# Patient Record
Sex: Male | Born: 1995 | Race: Black or African American | Hispanic: No | Marital: Single | Smoking: Current every day smoker
Health system: Southern US, Community
[De-identification: ages and names within clinical notes are randomized; demographics above are authoritative.]

## PROBLEM LIST (undated history)

## (undated) DIAGNOSIS — F1910 Other psychoactive substance abuse, uncomplicated: Secondary | ICD-10-CM

## (undated) DIAGNOSIS — Z789 Other specified health status: Secondary | ICD-10-CM

---
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
## 2020-02-10 ENCOUNTER — Emergency Department (HOSPITAL_COMMUNITY): Payer: Self-pay

## 2020-02-10 ENCOUNTER — Other Ambulatory Visit: Payer: Self-pay

## 2020-02-10 ENCOUNTER — Encounter (HOSPITAL_COMMUNITY): Payer: Self-pay

## 2020-02-10 ENCOUNTER — Inpatient Hospital Stay (HOSPITAL_COMMUNITY)
Admission: EM | Admit: 2020-02-10 | Discharge: 2020-02-15 | DRG: 501 | Disposition: A | Discharge status: Home Health | Payer: Self-pay | Attending: Student | Admitting: Student

## 2020-02-10 DIAGNOSIS — S76312A Strain of muscle, fascia and tendon of the posterior muscle group at thigh level, left thigh, initial encounter: Secondary | ICD-10-CM

## 2020-02-10 DIAGNOSIS — S81802A Unspecified open wound, left lower leg, initial encounter: Secondary | ICD-10-CM | POA: Diagnosis present

## 2020-02-10 DIAGNOSIS — S92002B Unspecified fracture of left calcaneus, initial encounter for open fracture: Secondary | ICD-10-CM | POA: Diagnosis present

## 2020-02-10 DIAGNOSIS — S86112A Strain of other muscle(s) and tendon(s) of posterior muscle group at lower leg level, left leg, initial encounter: Secondary | ICD-10-CM

## 2020-02-10 DIAGNOSIS — F419 Anxiety disorder, unspecified: Secondary | ICD-10-CM | POA: Diagnosis not present

## 2020-02-10 DIAGNOSIS — F17210 Nicotine dependence, cigarettes, uncomplicated: Secondary | ICD-10-CM | POA: Diagnosis present

## 2020-02-10 DIAGNOSIS — S86122A Laceration of other muscle(s) and tendon(s) of posterior muscle group at lower leg level, left leg, initial encounter: Principal | ICD-10-CM | POA: Diagnosis present

## 2020-02-10 DIAGNOSIS — Z91010 Allergy to peanuts: Secondary | ICD-10-CM

## 2020-02-10 DIAGNOSIS — S01112A Laceration without foreign body of left eyelid and periocular area, initial encounter: Secondary | ICD-10-CM | POA: Diagnosis present

## 2020-02-10 DIAGNOSIS — S91312A Laceration without foreign body, left foot, initial encounter: Secondary | ICD-10-CM

## 2020-02-10 DIAGNOSIS — T1490XA Injury, unspecified, initial encounter: Secondary | ICD-10-CM

## 2020-02-10 DIAGNOSIS — S81012A Laceration without foreign body, left knee, initial encounter: Secondary | ICD-10-CM

## 2020-02-10 DIAGNOSIS — D62 Acute posthemorrhagic anemia: Secondary | ICD-10-CM | POA: Diagnosis not present

## 2020-02-10 DIAGNOSIS — S92002A Unspecified fracture of left calcaneus, initial encounter for closed fracture: Secondary | ICD-10-CM

## 2020-02-10 DIAGNOSIS — Z20822 Contact with and (suspected) exposure to covid-19: Secondary | ICD-10-CM | POA: Diagnosis present

## 2020-02-10 DIAGNOSIS — T148XXA Other injury of unspecified body region, initial encounter: Secondary | ICD-10-CM

## 2020-02-10 DIAGNOSIS — S81022A Laceration with foreign body, left knee, initial encounter: Secondary | ICD-10-CM | POA: Diagnosis present

## 2020-02-10 DIAGNOSIS — Z23 Encounter for immunization: Secondary | ICD-10-CM

## 2020-02-10 HISTORY — DX: Other specified health status: Z78.9

## 2020-02-10 HISTORY — DX: Unspecified open wound, left lower leg, initial encounter: S81.802A

## 2020-02-10 LAB — I-STAT CHEM 8, ED
BUN: 10 mg/dL (ref 6–20)
Calcium, Ion: 1.04 mmol/L — ABNORMAL LOW (ref 1.15–1.40)
Chloride: 102 mmol/L (ref 98–111)
Creatinine, Ser: 1.6 mg/dL — ABNORMAL HIGH (ref 0.61–1.24)
Glucose, Bld: 140 mg/dL — ABNORMAL HIGH (ref 70–99)
HCT: 37 % — ABNORMAL LOW (ref 39.0–52.0)
Hemoglobin: 12.6 g/dL — ABNORMAL LOW (ref 13.0–17.0)
Potassium: 2.8 mmol/L — ABNORMAL LOW (ref 3.5–5.1)
Sodium: 139 mmol/L (ref 135–145)
TCO2: 21 mmol/L — ABNORMAL LOW (ref 22–32)

## 2020-02-10 LAB — LACTIC ACID, PLASMA: Lactic Acid, Venous: 3.5 mmol/L (ref 0.5–1.9)

## 2020-02-10 LAB — COMPREHENSIVE METABOLIC PANEL
ALT: 18 U/L (ref 0–44)
AST: 24 U/L (ref 15–41)
Albumin: 3.8 g/dL (ref 3.5–5.0)
Alkaline Phosphatase: 25 U/L — ABNORMAL LOW (ref 38–126)
Anion gap: 14 (ref 5–15)
BUN: 10 mg/dL (ref 6–20)
CO2: 19 mmol/L — ABNORMAL LOW (ref 22–32)
Calcium: 9.1 mg/dL (ref 8.9–10.3)
Chloride: 104 mmol/L (ref 98–111)
Creatinine, Ser: 1.6 mg/dL — ABNORMAL HIGH (ref 0.61–1.24)
GFR calc Af Amer: >60 mL/min (ref >60)
GFR calc non Af Amer: 59 mL/min — ABNORMAL LOW (ref >60)
Glucose, Bld: 143 mg/dL — ABNORMAL HIGH (ref 70–99)
Potassium: 2.9 mmol/L — ABNORMAL LOW (ref 3.5–5.1)
Sodium: 137 mmol/L (ref 135–145)
Total Bilirubin: 0.6 mg/dL (ref 0.3–1.2)
Total Protein: 6.3 g/dL — ABNORMAL LOW (ref 6.5–8.1)

## 2020-02-10 LAB — PROTIME-INR
INR: 1.3 — ABNORMAL HIGH (ref 0.8–1.2)
Prothrombin Time: 15.5 seconds — ABNORMAL HIGH (ref 11.4–15.2)

## 2020-02-10 LAB — TYPE AND SCREEN
ABO/RH(D): AB POS
Antibody Screen: NEGATIVE

## 2020-02-10 LAB — ABO/RH: ABO/RH(D): AB POS

## 2020-02-10 LAB — CBC
HCT: 37.3 % — ABNORMAL LOW (ref 39.0–52.0)
Hemoglobin: 12.1 g/dL — ABNORMAL LOW (ref 13.0–17.0)
MCH: 30.7 pg (ref 26.0–34.0)
MCHC: 32.4 g/dL (ref 30.0–36.0)
MCV: 94.7 fL (ref 80.0–100.0)
Platelets: 226 10*3/uL (ref 150–400)
RBC: 3.94 MIL/uL — ABNORMAL LOW (ref 4.22–5.81)
RDW: 13.2 % (ref 11.5–15.5)
WBC: 9.9 10*3/uL (ref 4.0–10.5)
nRBC: 0 % (ref 0.0–0.2)

## 2020-02-10 LAB — SARS CORONAVIRUS 2 BY RT PCR (HOSPITAL ORDER, PERFORMED IN ~~LOC~~ HOSPITAL LAB): SARS Coronavirus 2: NEGATIVE

## 2020-02-10 LAB — ETHANOL: Alcohol, Ethyl (B): <10 mg/dL (ref <10)

## 2020-02-10 MED ORDER — POTASSIUM CHLORIDE IN NACL 20-0.45 MEQ/L-% IV SOLN
INTRAVENOUS | Status: DC
  Filled 2020-02-10 (×4): qty 1000

## 2020-02-10 MED ORDER — ACETAMINOPHEN 500 MG PO TABS
1000.0000 mg | ORAL_TABLET | Freq: Four times a day (QID) | ORAL | Status: DC
  Administered 2020-02-10 – 2020-02-11 (×2): 1000 mg via ORAL
  Filled 2020-02-10 (×2): qty 2

## 2020-02-10 MED ORDER — KETOROLAC TROMETHAMINE 15 MG/ML IJ SOLN
15.0000 mg | Freq: Four times a day (QID) | INTRAMUSCULAR | Status: DC
  Administered 2020-02-11 (×2): 15 mg via INTRAVENOUS
  Filled 2020-02-10 (×2): qty 1

## 2020-02-10 MED ORDER — TETANUS-DIPHTH-ACELL PERTUSSIS 5-2.5-18.5 LF-MCG/0.5 IM SUSP
0.5000 mL | Freq: Once | INTRAMUSCULAR | Status: AC
  Administered 2020-02-10: 0.5 mL via INTRAMUSCULAR

## 2020-02-10 MED ORDER — ONDANSETRON HCL 4 MG PO TABS: 4.0000 mg | ORAL_TABLET | Freq: Four times a day (QID) | ORAL | Status: DC | PRN

## 2020-02-10 MED ORDER — CEFAZOLIN SODIUM-DEXTROSE 2-4 GM/100ML-% IV SOLN
2.0000 g | Freq: Three times a day (TID) | INTRAVENOUS | Status: DC
  Administered 2020-02-11: 2 g via INTRAVENOUS
  Filled 2020-02-10 (×2): qty 100

## 2020-02-10 MED ORDER — IOHEXOL 350 MG/ML SOLN
100.0000 mL | Freq: Once | INTRAVENOUS | Status: AC | PRN
  Administered 2020-02-10: 100 mL via INTRAVENOUS

## 2020-02-10 MED ORDER — OXYCODONE HCL 5 MG PO TABS
5.0000 mg | ORAL_TABLET | ORAL | Status: DC | PRN
  Administered 2020-02-11: 10 mg via ORAL
  Administered 2020-02-11: 5 mg via ORAL
  Administered 2020-02-12 – 2020-02-15 (×4): 10 mg via ORAL
  Filled 2020-02-10 (×9): qty 2

## 2020-02-10 MED ORDER — ONDANSETRON HCL 4 MG/2ML IJ SOLN
4.0000 mg | Freq: Four times a day (QID) | INTRAMUSCULAR | Status: DC | PRN
  Administered 2020-02-15: 4 mg via INTRAVENOUS
  Filled 2020-02-10: qty 2

## 2020-02-10 MED ORDER — HYDROMORPHONE HCL 1 MG/ML IJ SOLN
INTRAMUSCULAR | Status: AC
  Filled 2020-02-10: qty 1

## 2020-02-10 MED ORDER — ACETAMINOPHEN 325 MG PO TABS: 325.0000 mg | ORAL_TABLET | Freq: Four times a day (QID) | ORAL | Status: DC | PRN

## 2020-02-10 MED ORDER — CEFAZOLIN SODIUM-DEXTROSE 2-4 GM/100ML-% IV SOLN
2.0000 g | Freq: Once | INTRAVENOUS | Status: AC
  Administered 2020-02-10: 2 g via INTRAVENOUS

## 2020-02-10 MED ORDER — SENNA 8.6 MG PO TABS
1.0000 | ORAL_TABLET | Freq: Two times a day (BID) | ORAL | Status: DC
  Administered 2020-02-11 – 2020-02-14 (×6): 8.6 mg via ORAL
  Filled 2020-02-10 (×7): qty 1

## 2020-02-10 MED ORDER — HYDROMORPHONE HCL 1 MG/ML IJ SOLN
INTRAMUSCULAR | Status: AC | PRN
  Administered 2020-02-10: 1 mg via INTRAVENOUS

## 2020-02-10 MED ORDER — HYDROMORPHONE HCL 1 MG/ML IJ SOLN
0.5000 mg | INTRAMUSCULAR | Status: DC | PRN
  Administered 2020-02-10 – 2020-02-12 (×3): 1 mg via INTRAVENOUS
  Filled 2020-02-10 (×2): qty 1

## 2020-02-10 MED ORDER — METHOCARBAMOL 1000 MG/10ML IJ SOLN
500.0000 mg | Freq: Four times a day (QID) | INTRAVENOUS | Status: DC | PRN
  Filled 2020-02-10: qty 5

## 2020-02-10 MED ORDER — DIPHENHYDRAMINE HCL 12.5 MG/5ML PO ELIX: 12.5000 mg | ORAL_SOLUTION | ORAL | Status: DC | PRN

## 2020-02-10 MED ORDER — GABAPENTIN 300 MG PO CAPS
300.0000 mg | ORAL_CAPSULE | Freq: Three times a day (TID) | ORAL | Status: DC
  Administered 2020-02-11 – 2020-02-15 (×13): 300 mg via ORAL
  Filled 2020-02-10 (×13): qty 1

## 2020-02-10 MED ORDER — DOCUSATE SODIUM 100 MG PO CAPS
100.0000 mg | ORAL_CAPSULE | Freq: Two times a day (BID) | ORAL | Status: DC
  Administered 2020-02-11 – 2020-02-15 (×8): 100 mg via ORAL
  Filled 2020-02-10 (×8): qty 1

## 2020-02-10 MED ORDER — METHOCARBAMOL 500 MG PO TABS
500.0000 mg | ORAL_TABLET | Freq: Four times a day (QID) | ORAL | Status: DC | PRN
  Administered 2020-02-15 (×2): 500 mg via ORAL
  Filled 2020-02-10: qty 1
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
## 2020-02-10 NOTE — Progress Notes (Signed)
Chaplain spoke to family in consult room. Family said they were waiting for doctor to come and give report. Chaplain offered up prayer and will be available. Rev. Lynnell Chad Pager [phone]

## 2020-02-10 NOTE — ED Notes (Signed)
Pt to CT w/ RN Carmie Kanner

## 2020-02-10 NOTE — ED Notes (Signed)
MS  Helane Gunther mother [phone]
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
## 2020-02-10 NOTE — Progress Notes (Signed)
Orthopedic Tech Progress Note Patient Details:  Micheal Bryant 1996-06-24 [id number] Level 2 Trauma  Patient ID: Micheal Bryant, male   DOB: 01-23-1996, 24 y.o.   MRN: [id number]   Micheal Bryant 02/10/2020, 9:14 PM
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
## 2020-02-11 ENCOUNTER — Encounter (HOSPITAL_COMMUNITY)
Admission: EM | Disposition: A | Discharge status: Home Health | Payer: Self-pay | Source: Home / Self Care | Attending: Student

## 2020-02-11 ENCOUNTER — Other Ambulatory Visit: Payer: Self-pay

## 2020-02-11 ENCOUNTER — Inpatient Hospital Stay (HOSPITAL_COMMUNITY): Payer: Self-pay | Admitting: Registered Nurse

## 2020-02-11 ENCOUNTER — Encounter (HOSPITAL_COMMUNITY): Payer: Self-pay | Admitting: Student

## 2020-02-11 DIAGNOSIS — S86112A Strain of other muscle(s) and tendon(s) of posterior muscle group at lower leg level, left leg, initial encounter: Secondary | ICD-10-CM

## 2020-02-11 DIAGNOSIS — S92002A Unspecified fracture of left calcaneus, initial encounter for closed fracture: Secondary | ICD-10-CM

## 2020-02-11 DIAGNOSIS — S96922A Laceration of unspecified muscle and tendon at ankle and foot level, left foot, initial encounter: Secondary | ICD-10-CM

## 2020-02-11 DIAGNOSIS — S76312A Strain of muscle, fascia and tendon of the posterior muscle group at thigh level, left thigh, initial encounter: Secondary | ICD-10-CM

## 2020-02-11 DIAGNOSIS — S91312A Laceration without foreign body, left foot, initial encounter: Secondary | ICD-10-CM

## 2020-02-11 DIAGNOSIS — S81012A Laceration without foreign body, left knee, initial encounter: Secondary | ICD-10-CM

## 2020-02-11 HISTORY — DX: Laceration without foreign body, left knee, initial encounter: S81.012A

## 2020-02-11 HISTORY — DX: Laceration without foreign body, left foot, initial encounter: S96.922A

## 2020-02-11 HISTORY — DX: Strain of other muscle(s) and tendon(s) of posterior muscle group at lower leg level, left leg, initial encounter: S86.112A

## 2020-02-11 HISTORY — PX: I & D EXTREMITY: SHX5045

## 2020-02-11 HISTORY — DX: Unspecified fracture of left calcaneus, initial encounter for closed fracture: S92.002A

## 2020-02-11 HISTORY — DX: Strain of muscle, fascia and tendon of the posterior muscle group at thigh level, left thigh, initial encounter: S76.312A

## 2020-02-11 HISTORY — DX: Laceration without foreign body, left foot, initial encounter: S91.312A

## 2020-02-11 HISTORY — PX: APPLICATION OF WOUND VAC: SHX5189

## 2020-02-11 LAB — POCT I-STAT, CHEM 8
BUN: 12 mg/dL (ref 6–20)
Calcium, Ion: 1.2 mmol/L (ref 1.15–1.40)
Chloride: 100 mmol/L (ref 98–111)
Creatinine, Ser: 1.3 mg/dL — ABNORMAL HIGH (ref 0.61–1.24)
Glucose, Bld: 115 mg/dL — ABNORMAL HIGH (ref 70–99)
HCT: 31 % — ABNORMAL LOW (ref 39.0–52.0)
Hemoglobin: 10.5 g/dL — ABNORMAL LOW (ref 13.0–17.0)
Potassium: 4 mmol/L (ref 3.5–5.1)
Sodium: 134 mmol/L — ABNORMAL LOW (ref 135–145)
TCO2: 26 mmol/L (ref 22–32)

## 2020-02-11 LAB — HIV ANTIBODY (ROUTINE TESTING W REFLEX): HIV Screen 4th Generation wRfx: NONREACTIVE

## 2020-02-11 LAB — SURGICAL PCR SCREEN
MRSA, PCR: NEGATIVE
Staphylococcus aureus: NEGATIVE

## 2020-02-11 SURGERY — IRRIGATION AND DEBRIDEMENT EXTREMITY
Anesthesia: General | Laterality: Left

## 2020-02-11 MED ORDER — TOBRAMYCIN SULFATE 1.2 G IJ SOLR
INTRAMUSCULAR | Status: DC | PRN
  Administered 2020-02-11: 1.2 g via TOPICAL

## 2020-02-11 MED ORDER — KETOROLAC TROMETHAMINE 30 MG/ML IJ SOLN
INTRAMUSCULAR | Status: AC
  Filled 2020-02-11: qty 1

## 2020-02-11 MED ORDER — HYDROMORPHONE HCL 1 MG/ML IJ SOLN: 0.2500 mg | INTRAMUSCULAR | Status: DC | PRN

## 2020-02-11 MED ORDER — ROCURONIUM BROMIDE 10 MG/ML (PF) SYRINGE
PREFILLED_SYRINGE | INTRAVENOUS | Status: DC | PRN
  Administered 2020-02-11: 50 mg via INTRAVENOUS

## 2020-02-11 MED ORDER — ONDANSETRON HCL 4 MG/2ML IJ SOLN
INTRAMUSCULAR | Status: DC | PRN
  Administered 2020-02-11: 4 mg via INTRAVENOUS

## 2020-02-11 MED ORDER — DEXAMETHASONE SODIUM PHOSPHATE 10 MG/ML IJ SOLN
INTRAMUSCULAR | Status: DC | PRN
  Administered 2020-02-11: 5 mg via INTRAVENOUS

## 2020-02-11 MED ORDER — ONDANSETRON HCL 4 MG/2ML IJ SOLN
INTRAMUSCULAR | Status: AC
  Filled 2020-02-11: qty 2

## 2020-02-11 MED ORDER — MIDAZOLAM HCL 2 MG/2ML IJ SOLN
INTRAMUSCULAR | Status: AC
  Filled 2020-02-11: qty 2

## 2020-02-11 MED ORDER — SODIUM CHLORIDE 0.9 % IV SOLN
2.0000 g | INTRAVENOUS | Status: DC
  Administered 2020-02-11 – 2020-02-12 (×2): 2 g via INTRAVENOUS
  Filled 2020-02-11 (×2): qty 20

## 2020-02-11 MED ORDER — CHLORHEXIDINE GLUCONATE 0.12 % MT SOLN: 15.0000 mL | Freq: Once | OROMUCOSAL | Status: AC

## 2020-02-11 MED ORDER — FENTANYL CITRATE (PF) 250 MCG/5ML IJ SOLN
INTRAMUSCULAR | Status: AC
  Filled 2020-02-11: qty 5

## 2020-02-11 MED ORDER — DEXMEDETOMIDINE HCL 200 MCG/2ML IV SOLN
INTRAVENOUS | Status: DC | PRN
  Administered 2020-02-11: 8 ug via INTRAVENOUS
  Administered 2020-02-11: 4 ug via INTRAVENOUS
  Administered 2020-02-11 (×3): 8 ug via INTRAVENOUS

## 2020-02-11 MED ORDER — KETOROLAC TROMETHAMINE 15 MG/ML IJ SOLN
15.0000 mg | Freq: Four times a day (QID) | INTRAMUSCULAR | Status: DC
  Administered 2020-02-11 – 2020-02-12 (×3): 15 mg via INTRAVENOUS
  Filled 2020-02-11 (×3): qty 1

## 2020-02-11 MED ORDER — SUGAMMADEX SODIUM 200 MG/2ML IV SOLN
INTRAVENOUS | Status: DC | PRN
  Administered 2020-02-11 (×2): 100 mg via INTRAVENOUS

## 2020-02-11 MED ORDER — PROPOFOL 10 MG/ML IV BOLUS
INTRAVENOUS | Status: AC
  Filled 2020-02-11: qty 20

## 2020-02-11 MED ORDER — SUCCINYLCHOLINE CHLORIDE 200 MG/10ML IV SOSY
PREFILLED_SYRINGE | INTRAVENOUS | Status: AC
  Filled 2020-02-11: qty 10

## 2020-02-11 MED ORDER — MIDAZOLAM HCL 5 MG/5ML IJ SOLN
INTRAMUSCULAR | Status: DC | PRN
  Administered 2020-02-11 (×2): 1 mg via INTRAVENOUS

## 2020-02-11 MED ORDER — VANCOMYCIN HCL 1000 MG IV SOLR
INTRAVENOUS | Status: AC
  Filled 2020-02-11: qty 1000

## 2020-02-11 MED ORDER — SUCCINYLCHOLINE CHLORIDE 200 MG/10ML IV SOSY
PREFILLED_SYRINGE | INTRAVENOUS | Status: DC | PRN
  Administered 2020-02-11: 140 mg via INTRAVENOUS

## 2020-02-11 MED ORDER — ENOXAPARIN SODIUM 40 MG/0.4ML ~~LOC~~ SOLN
40.0000 mg | SUBCUTANEOUS | Status: DC
  Administered 2020-02-12 – 2020-02-14 (×3): 40 mg via SUBCUTANEOUS
  Filled 2020-02-11 (×3): qty 0.4

## 2020-02-11 MED ORDER — LIDOCAINE 2% (20 MG/ML) 5 ML SYRINGE
INTRAMUSCULAR | Status: AC
  Filled 2020-02-11: qty 5

## 2020-02-11 MED ORDER — CHLORHEXIDINE GLUCONATE 0.12 % MT SOLN
OROMUCOSAL | Status: AC
  Administered 2020-02-11: 15 mL via OROMUCOSAL
  Filled 2020-02-11: qty 15

## 2020-02-11 MED ORDER — LACTATED RINGERS IV SOLN: INTRAVENOUS | Status: DC

## 2020-02-11 MED ORDER — ACETAMINOPHEN 500 MG PO TABS
1000.0000 mg | ORAL_TABLET | Freq: Four times a day (QID) | ORAL | Status: DC
  Administered 2020-02-11 – 2020-02-15 (×13): 1000 mg via ORAL
  Filled 2020-02-11 (×13): qty 2

## 2020-02-11 MED ORDER — DEXAMETHASONE SODIUM PHOSPHATE 10 MG/ML IJ SOLN
INTRAMUSCULAR | Status: AC
  Filled 2020-02-11: qty 1

## 2020-02-11 MED ORDER — SODIUM CHLORIDE 0.9 % IR SOLN
Status: DC | PRN
  Administered 2020-02-11: 1000 mL
  Administered 2020-02-11 (×3): 3000 mL

## 2020-02-11 MED ORDER — PROPOFOL 10 MG/ML IV BOLUS
INTRAVENOUS | Status: DC | PRN
  Administered 2020-02-11: 140 mg via INTRAVENOUS

## 2020-02-11 MED ORDER — OXYCODONE HCL 5 MG PO TABS
10.0000 mg | ORAL_TABLET | ORAL | Status: DC | PRN
  Administered 2020-02-11 – 2020-02-13 (×2): 10 mg via ORAL
  Administered 2020-02-13 (×2): 15 mg via ORAL
  Administered 2020-02-14 – 2020-02-15 (×3): 10 mg via ORAL
  Filled 2020-02-11: qty 3
  Filled 2020-02-11: qty 2
  Filled 2020-02-11: qty 3
  Filled 2020-02-11: qty 2

## 2020-02-11 MED ORDER — ROCURONIUM BROMIDE 10 MG/ML (PF) SYRINGE
PREFILLED_SYRINGE | INTRAVENOUS | Status: AC
  Filled 2020-02-11: qty 10

## 2020-02-11 MED ORDER — TOBRAMYCIN SULFATE 1.2 G IJ SOLR
INTRAMUSCULAR | Status: AC
  Filled 2020-02-11: qty 1.2

## 2020-02-11 MED ORDER — FENTANYL CITRATE (PF) 250 MCG/5ML IJ SOLN
INTRAMUSCULAR | Status: DC | PRN
  Administered 2020-02-11 (×3): 50 ug via INTRAVENOUS

## 2020-02-11 MED ORDER — HYDRALAZINE HCL 10 MG PO TABS: 10.0000 mg | ORAL_TABLET | Freq: Four times a day (QID) | ORAL | Status: DC | PRN

## 2020-02-11 MED ORDER — 0.9 % SODIUM CHLORIDE (POUR BTL) OPTIME
TOPICAL | Status: DC | PRN
  Administered 2020-02-11: 1000 mL

## 2020-02-11 MED ORDER — LIDOCAINE 2% (20 MG/ML) 5 ML SYRINGE
INTRAMUSCULAR | Status: DC | PRN
  Administered 2020-02-11: 100 mg via INTRAVENOUS

## 2020-02-11 MED ORDER — VANCOMYCIN HCL 1000 MG IV SOLR
INTRAVENOUS | Status: DC | PRN
  Administered 2020-02-11: 1000 mg via TOPICAL

## 2020-02-11 SURGICAL SUPPLY — 62 items
BNDG COHESIVE 4X5 TAN STRL (GAUZE/BANDAGES/DRESSINGS) ×3 IMPLANT
BNDG ELASTIC 4X5.8 VLCR STR LF (GAUZE/BANDAGES/DRESSINGS) IMPLANT
BNDG GAUZE ELAST 4 BULKY (GAUZE/BANDAGES/DRESSINGS) ×6 IMPLANT
BRUSH SCRUB EZ PLAIN DRY (MISCELLANEOUS) ×6 IMPLANT
CANISTER SUCT 3000ML PPV (MISCELLANEOUS) ×3 IMPLANT
CANISTER WOUND CARE 500ML ATS (WOUND CARE) ×3 IMPLANT
CHLORAPREP W/TINT 26 (MISCELLANEOUS) ×3 IMPLANT
CONNECTOR Y ATS VAC SYSTEM (MISCELLANEOUS) ×3 IMPLANT
COVER MAYO STAND STRL (DRAPES) ×3 IMPLANT
COVER SURGICAL LIGHT HANDLE (MISCELLANEOUS) ×6 IMPLANT
COVER WAND RF STERILE (DRAPES) ×3 IMPLANT
DRAPE HALF SHEET 40X57 (DRAPES) IMPLANT
DRAPE INCISE IOBAN 66X45 STRL (DRAPES) IMPLANT
DRAPE ORTHO SPLIT 77X108 STRL (DRAPES) ×3
DRAPE SURG 17X23 STRL (DRAPES) ×3 IMPLANT
DRAPE SURG ORHT 6 SPLT 77X108 (DRAPES) ×1 IMPLANT
DRAPE U-SHAPE 47X51 STRL (DRAPES) ×3 IMPLANT
DRESSING VERAFLO CLEANSE CC (GAUZE/BANDAGES/DRESSINGS) ×1 IMPLANT
DRSG ADAPTIC 3X8 NADH LF (GAUZE/BANDAGES/DRESSINGS) ×3 IMPLANT
DRSG PAD ABDOMINAL 8X10 ST (GAUZE/BANDAGES/DRESSINGS) IMPLANT
DRSG VAC ATS LRG SENSATRAC (GAUZE/BANDAGES/DRESSINGS) IMPLANT
DRSG VAC ATS MED SENSATRAC (GAUZE/BANDAGES/DRESSINGS) IMPLANT
DRSG VAC ATS SM SENSATRAC (GAUZE/BANDAGES/DRESSINGS) IMPLANT
DRSG VERAFLO CLEANSE CC (GAUZE/BANDAGES/DRESSINGS) ×3
ELECT REM PT RETURN 9FT ADLT (ELECTROSURGICAL) ×3
ELECTRODE REM PT RTRN 9FT ADLT (ELECTROSURGICAL) ×1 IMPLANT
EVACUATOR 1/8 PVC DRAIN (DRAIN) IMPLANT
GAUZE SPONGE 4X4 12PLY STRL (GAUZE/BANDAGES/DRESSINGS) ×3 IMPLANT
GAUZE XEROFORM 5X9 LF (GAUZE/BANDAGES/DRESSINGS) ×3 IMPLANT
GLOVE BIO SURGEON STRL SZ 6.5 (GLOVE) ×6 IMPLANT
GLOVE BIO SURGEON STRL SZ7.5 (GLOVE) ×12 IMPLANT
GLOVE BIO SURGEONS STRL SZ 6.5 (GLOVE) ×3
GLOVE BIOGEL PI IND STRL 6.5 (GLOVE) ×1 IMPLANT
GLOVE BIOGEL PI IND STRL 7.5 (GLOVE) ×1 IMPLANT
GLOVE BIOGEL PI INDICATOR 6.5 (GLOVE) ×2
GLOVE BIOGEL PI INDICATOR 7.5 (GLOVE) ×2
GOWN STRL REUS W/ TWL LRG LVL3 (GOWN DISPOSABLE) ×2 IMPLANT
GOWN STRL REUS W/TWL LRG LVL3 (GOWN DISPOSABLE) ×6
HANDPIECE INTERPULSE COAX TIP (DISPOSABLE)
IMMOBILIZER KNEE 22 UNIV (SOFTGOODS) ×3 IMPLANT
KIT BASIN OR (CUSTOM PROCEDURE TRAY) ×3 IMPLANT
KIT TURNOVER KIT B (KITS) ×3 IMPLANT
MANIFOLD NEPTUNE II (INSTRUMENTS) ×3 IMPLANT
NS IRRIG 1000ML POUR BTL (IV SOLUTION) ×3 IMPLANT
PACK ORTHO EXTREMITY (CUSTOM PROCEDURE TRAY) ×3 IMPLANT
PAD ARMBOARD 7.5X6 YLW CONV (MISCELLANEOUS) ×6 IMPLANT
PAD NEG PRESSURE SENSATRAC (MISCELLANEOUS) ×6 IMPLANT
PADDING CAST COTTON 6X4 STRL (CAST SUPPLIES) ×3 IMPLANT
SET HNDPC FAN SPRY TIP SCT (DISPOSABLE) IMPLANT
SPONGE LAP 18X18 RF (DISPOSABLE) ×3 IMPLANT
SUT ETHILON 2 0 FS 18 (SUTURE) ×6 IMPLANT
SUT ETHILON 3 0 PS 1 (SUTURE) ×6 IMPLANT
SUT MON AB 2-0 CT1 36 (SUTURE) ×3 IMPLANT
SUT PDS AB 0 CT 36 (SUTURE) IMPLANT
SWAB CULTURE ESWAB REG 1ML (MISCELLANEOUS) IMPLANT
TOWEL GREEN STERILE (TOWEL DISPOSABLE) ×6 IMPLANT
TOWEL GREEN STERILE FF (TOWEL DISPOSABLE) ×3 IMPLANT
TUBE CONNECTING 12'X1/4 (SUCTIONS) ×1
TUBE CONNECTING 12X1/4 (SUCTIONS) ×2 IMPLANT
UNDERPAD 30X36 HEAVY ABSORB (UNDERPADS AND DIAPERS) ×3 IMPLANT
WATER STERILE IRR 1000ML POUR (IV SOLUTION) ×3 IMPLANT
YANKAUER SUCT BULB TIP NO VENT (SUCTIONS) ×3 IMPLANT

## 2020-02-11 NOTE — Anesthesia Preprocedure Evaluation (Signed)
Anesthesia Evaluation  Patient identified by MRN, date of birth, ID band Patient awake    Reviewed: Allergy & Precautions, NPO status , Patient's Chart, lab work & pertinent test results  Airway Mallampati: II  TM Distance: >3 FB     Dental   Pulmonary Current Smoker,    breath sounds clear to auscultation       Cardiovascular  Rhythm:Regular Rate:Normal     Neuro/Psych    GI/Hepatic negative GI ROS, Neg liver ROS,   Endo/Other  negative endocrine ROS  Renal/GU negative Renal ROS     Musculoskeletal   Abdominal   Peds  Hematology   Anesthesia Other Findings   Reproductive/Obstetrics                             Anesthesia Physical Anesthesia Plan  ASA: II  Anesthesia Plan: General   Post-op Pain Management:    Induction: Intravenous  PONV Risk Score and Plan: 2 and Ondansetron, Dexamethasone and Midazolam  Airway Management Planned: Oral ETT  Additional Equipment:   Intra-op Plan:   Post-operative Plan: Extubation in OR  Informed Consent: I have reviewed the patients History and Physical, chart, labs and discussed the procedure including the risks, benefits and alternatives for the proposed anesthesia with the patient or authorized representative who has indicated his/her understanding and acceptance.     Interpreter used for [employer] Discussed with: CRNA  Anesthesia Plan Comments:         Anesthesia Quick Evaluation

## 2020-02-11 NOTE — H&P (Signed)
Please see orthopaedic consult note from same date for full orthopaedic H&P.  Roby Lofts, MD [facility] [phone] (office) [email or web address]
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
## 2020-02-11 NOTE — Transfer of Care (Signed)
Immediate Anesthesia Transfer of Care Note  Patient: Micheal Bryant  Procedure(s) Performed: IRRIGATION AND DEBRIDEMENT EXTREMITY (Left ) APPLICATION OF WOUND VAC (Left )  Patient Location: PACU  Anesthesia Type:General  Level of Consciousness: awake, drowsy and patient cooperative  Airway & Oxygen Therapy: Patient Spontanous Breathing and Patient connected to nasal cannula oxygen  Post-op Assessment: Report given to RN, Post -op Vital signs reviewed and stable and Patient moving all extremities X 4  Post vital signs: Reviewed and stable  Last Vitals:  Vitals Value Taken Time  BP 114/70 02/11/20 1340  Temp    Pulse    Resp 17 02/11/20 1340  SpO2    Vitals shown include unvalidated device data.  Last Pain:  Vitals:   02/11/20 0914  TempSrc:   PainSc: 9       Patients Stated Pain Goal: 0 (02/11/20 0914)  Complications: No complications documented.

## 2020-02-11 NOTE — Anesthesia Procedure Notes (Signed)
Procedure Name: Intubation Date/Time: 02/11/2020 11:37 AM Performed by: Lanell Matar, CRNA Pre-anesthesia Checklist: Patient identified, Emergency Drugs available, Suction available and Patient being monitored Patient Re-evaluated:Patient Re-evaluated prior to induction Oxygen Delivery Method: Circle System Utilized Preoxygenation: Pre-oxygenation with 100% oxygen Induction Type: IV induction Ventilation: Mask ventilation without difficulty Laryngoscope Size: Miller and 2 Grade View: Grade I Tube type: Oral Tube size: 7.5 mm Number of attempts: 1 Airway Equipment and Method: Stylet and Oral airway Placement Confirmation: ETT inserted through vocal cords under direct vision,  positive ETCO2 and breath sounds checked- equal and bilateral Secured at: 22 cm Tube secured with: Tape Dental Injury: Teeth and Oropharynx as per pre-operative assessment
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
## 2020-02-11 NOTE — Anesthesia Postprocedure Evaluation (Signed)
Anesthesia Post Note  Patient: Micheal Bryant  Procedure(s) Performed: IRRIGATION AND DEBRIDEMENT EXTREMITY (Left ) APPLICATION OF WOUND VAC (Left )     Patient location during evaluation: PACU Anesthesia Type: General Level of consciousness: awake Pain management: pain level controlled Vital Signs Assessment: post-procedure vital signs reviewed and stable Respiratory status: spontaneous breathing Cardiovascular status: stable Postop Assessment: no apparent nausea or vomiting Anesthetic complications: no   No complications documented.  Last Vitals:  Vitals:   02/11/20 1420 02/11/20 1425  BP:  113/76  Pulse: 69 69  Resp: 12 12  Temp:    SpO2: 99% 98%    Last Pain:  Vitals:   02/11/20 1400  TempSrc:   PainSc: Asleep                 Jasson Siegmann

## 2020-02-12 ENCOUNTER — Encounter (HOSPITAL_COMMUNITY): Payer: Self-pay | Admitting: Student

## 2020-02-12 LAB — CBC
HCT: 28.5 % — ABNORMAL LOW (ref 39.0–52.0)
Hemoglobin: 9.4 g/dL — ABNORMAL LOW (ref 13.0–17.0)
MCH: 30.9 pg (ref 26.0–34.0)
MCHC: 33 g/dL (ref 30.0–36.0)
MCV: 93.8 fL (ref 80.0–100.0)
Platelets: 152 10*3/uL (ref 150–400)
RBC: 3.04 MIL/uL — ABNORMAL LOW (ref 4.22–5.81)
RDW: 13.2 % (ref 11.5–15.5)
WBC: 9.4 10*3/uL (ref 4.0–10.5)
nRBC: 0 % (ref 0.0–0.2)

## 2020-02-12 LAB — VITAMIN D 25 HYDROXY (VIT D DEFICIENCY, FRACTURES): Vit D, 25-Hydroxy: 12.91 ng/mL — ABNORMAL LOW (ref 30–100)

## 2020-02-12 MED ORDER — SODIUM CHLORIDE 0.9 % IV SOLN
2.0000 g | INTRAVENOUS | Status: AC
  Administered 2020-02-13 – 2020-02-14 (×2): 2 g via INTRAVENOUS
  Filled 2020-02-12 (×2): qty 20

## 2020-02-12 MED ORDER — LORAZEPAM 0.5 MG PO TABS
0.5000 mg | ORAL_TABLET | Freq: Four times a day (QID) | ORAL | Status: DC | PRN
  Administered 2020-02-12 – 2020-02-14 (×4): 1 mg via ORAL
  Filled 2020-02-12 (×4): qty 2

## 2020-02-12 NOTE — Progress Notes (Signed)
Rehab Admissions Coordinator Note:  Per PT recommendation, this patient was screened by Cheri Rous for appropriateness for an Inpatient Acute Rehab Consult. It is noted per PT note that despite education on benefits, the pt is declining post acute rehab at this time. *IF pt were to change his mind and want to pursue an IP Rehab program, please contact this AC.   Cheri Rous 02/12/2020, 2:01 PM  I can be reached at [phone].
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
## 2020-02-13 NOTE — Progress Notes (Signed)
Wound vac is not plugged to the wall during bedside reporting. Wound vac restarted however keeps saying on the monitor 'blockage alert'. Wound vac dressing on the left thigh are clean and intact as well as on the left heel. However, the tubings on the left heel has some dried blood and not suctioning anything and seemed like occlusion is coming from it. Will notify the doctor.

## 2020-02-13 NOTE — Plan of Care (Signed)
  Problem: Education: Goal: Knowledge of General Education information will improve Description: Including pain rating scale, medication(s)/side effects and non-pharmacologic comfort measures Outcome: Progressing   Problem: Health Behavior/Discharge Planning: Goal: Ability to manage health-related needs will improve Outcome: Progressing   Problem: Activity: Goal: Risk for activity intolerance will decrease Outcome: Progressing   Problem: Nutrition: Goal: Adequate nutrition will be maintained Outcome: Progressing   Problem: Coping: Goal: Level of anxiety will decrease Outcome: Progressing   Problem: Elimination: Goal: Will not experience complications related to bowel motility Outcome: Progressing   Problem: Safety: Goal: Ability to remain free from injury will improve Outcome: Progressing   Problem: Skin Integrity: Goal: Risk for impaired skin integrity will decrease Outcome: Progressing   
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
## 2020-02-13 NOTE — Plan of Care (Signed)
  Problem: Education: Goal: Knowledge of General Education information will improve Description Including pain rating scale, medication(s)/side effects and non-pharmacologic comfort measures Outcome: Progressing   Problem: Health Behavior/Discharge Planning: Goal: Ability to manage health-related needs will improve Outcome: Progressing   

## 2020-02-14 ENCOUNTER — Encounter (HOSPITAL_COMMUNITY): Payer: Self-pay | Admitting: Student

## 2020-02-14 LAB — CBC
HCT: 25.1 % — ABNORMAL LOW (ref 39.0–52.0)
Hemoglobin: 8.3 g/dL — ABNORMAL LOW (ref 13.0–17.0)
MCH: 31 pg (ref 26.0–34.0)
MCHC: 33.1 g/dL (ref 30.0–36.0)
MCV: 93.7 fL (ref 80.0–100.0)
Platelets: 173 10*3/uL (ref 150–400)
RBC: 2.68 MIL/uL — ABNORMAL LOW (ref 4.22–5.81)
RDW: 13.1 % (ref 11.5–15.5)
WBC: 5 10*3/uL (ref 4.0–10.5)
nRBC: 0 % (ref 0.0–0.2)

## 2020-02-14 LAB — BASIC METABOLIC PANEL
Anion gap: 7 (ref 5–15)
BUN: <5 mg/dL — ABNORMAL LOW (ref 6–20)
CO2: 25 mmol/L (ref 22–32)
Calcium: 8.4 mg/dL — ABNORMAL LOW (ref 8.9–10.3)
Chloride: 102 mmol/L (ref 98–111)
Creatinine, Ser: 1.04 mg/dL (ref 0.61–1.24)
GFR calc Af Amer: >60 mL/min (ref >60)
GFR calc non Af Amer: >60 mL/min (ref >60)
Glucose, Bld: 105 mg/dL — ABNORMAL HIGH (ref 70–99)
Potassium: 3.4 mmol/L — ABNORMAL LOW (ref 3.5–5.1)
Sodium: 134 mmol/L — ABNORMAL LOW (ref 135–145)

## 2020-02-14 NOTE — Anesthesia Preprocedure Evaluation (Addendum)
Anesthesia Evaluation  Patient identified by MRN, date of birth, ID band Patient awake    Reviewed: Allergy & Precautions, NPO status , Patient's Chart, lab work & pertinent test results  Airway Mallampati: II  TM Distance: >3 FB Neck ROM: Full    Dental  (+) Teeth Intact   Pulmonary Current Smoker and Patient abstained from smoking.,    Pulmonary exam normal breath sounds clear to auscultation       Cardiovascular negative cardio ROS Normal cardiovascular exam Rhythm:Regular Rate:Normal     Neuro/Psych  Neuromuscular disease negative psych ROS   GI/Hepatic negative GI ROS, Neg liver ROS,   Endo/Other  negative endocrine ROS  Renal/GU negative Renal ROS  negative genitourinary   Musculoskeletal Open wound left leg Left hamstring tear   Abdominal   Peds  Hematology negative hematology ROS (+)   Anesthesia Other Findings   Reproductive/Obstetrics                            Anesthesia Physical Anesthesia Plan  ASA: II  Anesthesia Plan: General   Post-op Pain Management:    Induction: Intravenous  PONV Risk Score and Plan: 2 and Ondansetron, Midazolam and Treatment may vary due to age or medical condition  Airway Management Planned: Oral ETT  Additional Equipment:   Intra-op Plan:   Post-operative Plan: Extubation in OR  Informed Consent: I have reviewed the patients History and Physical, chart, labs and discussed the procedure including the risks, benefits and alternatives for the proposed anesthesia with the patient or authorized representative who has indicated his/her understanding and acceptance.     Dental advisory given  Plan Discussed with: CRNA and Surgeon  Anesthesia Plan Comments:        Anesthesia Quick Evaluation

## 2020-02-14 NOTE — Progress Notes (Signed)
   ORTHOPAEDIC PROGRESS NOTE  s/p Procedure(s): 1. Irrigation and debridement of left posterior leg wound involving muscle and fascia (total size: 200 sq cm) 2. Irrigation and debridement of left open calcaneus fracture 3. Closed treatment of left calcaneus fracture 4. Intermediate closure of left heel pad avulsion (14 cm length) 5. Intermediate repair of left posterior leg laceration (25cm length)  5. Incisional wound vac placement On 02/11/2020 with Dr. Jena Gauss  SUBJECTIVE:  Wound vac on left heel had some clotting within the tube last night. "Blockage alert" was noted on the monitor. Spoke with the nursing staff over night. We discussed to flush/change tubing versus applying new dressing. Wound vac dressing on left heel was changed last night. Patient continues to want to be left alone. He is pleasant with conversation however. Pain manageable with medications. No chest pain. No SOB. No nausea/vomiting. No other complaints.  OBJECTIVE: PE: General: Resting in hospital bed. No acute distress.  Respiratory: No increased work of breathing.  Left Lower Extremity: Knee Immobilizer in place. Incisional vac in place posterior leg and heel with good seal and function, ~150 mL output in wound vac canister. Endorses distal sensation. Ankle dorsiflexion/plantarflexion intact. Able to wiggle each of his toes. Extremity warm.   Vitals:   02/14/20 0345 02/14/20 0826  BP: (!) 112/56 117/69  Pulse: 84 99  Resp: 15 16  Temp: 99.1 F (37.3 C) 98.6 F (37 C)  SpO2: 98% 100%    ASSESSMENT & PLAN: Micheal Bryant is a 24 y.o. male POD#3  Injuries:  1. Left posterior knee laceration s/p I&D, wound closure, and placement of wound vac 2. Laceration of gastrocnemius and hamstring muscle bellies s/p I&D, wound closure, and placement of wound vac 3. Left heel pad laceration s/p I&D, wound closure, and placement of wound vac 4. Avulsion left calcaneus fracture s/p I&D and closed treatment                   Weightbearing: NWB LLE                  Insicional and dressing care: Dressings left intact until follow-up. Left heel wound vac had a blockage in the tubing and was changed overnight by nursing staff. Wound vac with good seal and suction both posterior leg and left heel.                   Showering: No showering yet. Okay for bed bath                  Orthopedic device(s): Wound KCL:EXNT knee, Left foot and Knee immobilizer LLE  CV/Blood loss:  Hemodynamically stable  Pain management:  1. Tylenol 1000 mg q 6 hours scheduled 2. Robaxin 500 mg q 6 hours PRN 3. Oxycodone 5-15 mg q 4 hours PRN 4. Neurontin 300 mg TID 5. Dilaudid 0.5-1 mg q 4 hours PRN  Anxiety: po Ativan as needed for anxiety VTE prophylaxis: Lovenox, SCDs ID: Ceftriaxone 2 gm x 48 hours Foley/Lines: No foley, KVO IVFs Medical co-morbidities: None noted Dispo: Dispo: Therapies as tolerated. Plan to return to OR on Monday 02/15/20 for repeat I&D LLE.   - NPO midnight   Contact information: After hours and holidays please check [email or web address] for group call information for [facility]   Alfonse Alpers, PA-C 02/14/2020

## 2020-02-14 NOTE — Progress Notes (Signed)
Wound vac dressing on left heel was clotted with some dried blood on the tubings. Wound vac dressing was changed per PA order. Wound vac now working with good seal on continuous suction. Will continue to monitor,.

## 2020-02-14 NOTE — Plan of Care (Signed)

## 2020-02-14 NOTE — Plan of Care (Signed)
  Problem: Coping: Goal: Level of anxiety will decrease Outcome: Progressing   Problem: Pain Managment: Goal: General experience of comfort will improve Outcome: Progressing   Problem: Safety: Goal: Ability to remain free from injury will improve Outcome: Progressing   Problem: Skin Integrity: Goal: Risk for impaired skin integrity will decrease Outcome: Progressing   

## 2020-02-14 NOTE — Plan of Care (Signed)
  Problem: Education: Goal: Knowledge of General Education information will improve Description: Including pain rating scale, medication(s)/side effects and non-pharmacologic comfort measures Outcome: Progressing   Problem: Nutrition: Goal: Adequate nutrition will be maintained Outcome: Progressing   Problem: Coping: Goal: Level of anxiety will decrease Outcome: Progressing   Problem: Elimination: Goal: Will not experience complications related to bowel motility Outcome: Progressing   Problem: Pain Managment: Goal: General experience of comfort will improve Outcome: Progressing   Problem: Safety: Goal: Ability to remain free from injury will improve Outcome: Progressing   Problem: Skin Integrity: Goal: Risk for impaired skin integrity will decrease Outcome: Progressing   

## 2020-02-15 ENCOUNTER — Encounter (HOSPITAL_COMMUNITY)
Admission: EM | Disposition: A | Discharge status: Home Health | Payer: Self-pay | Source: Home / Self Care | Attending: Student

## 2020-02-15 ENCOUNTER — Encounter (HOSPITAL_COMMUNITY): Payer: Self-pay | Admitting: Student

## 2020-02-15 ENCOUNTER — Inpatient Hospital Stay (HOSPITAL_COMMUNITY): Payer: Self-pay | Admitting: Anesthesiology

## 2020-02-15 HISTORY — PX: IRRIGATION AND DEBRIDEMENT KNEE: SHX5185

## 2020-02-15 HISTORY — PX: APPLICATION OF WOUND VAC: SHX5189

## 2020-02-15 SURGERY — IRRIGATION AND DEBRIDEMENT KNEE
Anesthesia: General | Site: Knee | Laterality: Left

## 2020-02-15 MED ORDER — PHENYLEPHRINE 40 MCG/ML (10ML) SYRINGE FOR IV PUSH (FOR BLOOD PRESSURE SUPPORT)
PREFILLED_SYRINGE | INTRAVENOUS | Status: AC
  Filled 2020-02-15: qty 10

## 2020-02-15 MED ORDER — DOCUSATE SODIUM 100 MG PO CAPS: 100.0000 mg | ORAL_CAPSULE | Freq: Two times a day (BID) | ORAL | 0 refills | Status: DC

## 2020-02-15 MED ORDER — FENTANYL CITRATE (PF) 100 MCG/2ML IJ SOLN
INTRAMUSCULAR | Status: DC | PRN
  Administered 2020-02-15 (×2): 50 ug via INTRAVENOUS
  Administered 2020-02-15: 100 ug via INTRAVENOUS
  Administered 2020-02-15: 50 ug via INTRAVENOUS

## 2020-02-15 MED ORDER — MEPERIDINE HCL 25 MG/ML IJ SOLN: 6.2500 mg | INTRAMUSCULAR | Status: DC | PRN

## 2020-02-15 MED ORDER — HYDROMORPHONE HCL 1 MG/ML IJ SOLN
0.2500 mg | INTRAMUSCULAR | Status: DC | PRN
  Administered 2020-02-15 (×3): 0.5 mg via INTRAVENOUS

## 2020-02-15 MED ORDER — METHOCARBAMOL 500 MG PO TABS: 500.0000 mg | ORAL_TABLET | Freq: Four times a day (QID) | ORAL | 0 refills | Status: DC | PRN

## 2020-02-15 MED ORDER — ONDANSETRON HCL 4 MG/2ML IJ SOLN: 4.0000 mg | Freq: Once | INTRAMUSCULAR | Status: DC | PRN

## 2020-02-15 MED ORDER — DEXAMETHASONE SODIUM PHOSPHATE 10 MG/ML IJ SOLN
INTRAMUSCULAR | Status: DC | PRN
  Administered 2020-02-15: 6 mg via INTRAVENOUS

## 2020-02-15 MED ORDER — ONDANSETRON HCL 4 MG/2ML IJ SOLN
INTRAMUSCULAR | Status: DC | PRN
  Administered 2020-02-15: 4 mg via INTRAVENOUS

## 2020-02-15 MED ORDER — ONDANSETRON HCL 4 MG/2ML IJ SOLN
INTRAMUSCULAR | Status: AC
  Filled 2020-02-15: qty 2

## 2020-02-15 MED ORDER — DEXMEDETOMIDINE HCL 200 MCG/2ML IV SOLN
INTRAVENOUS | Status: DC | PRN
  Administered 2020-02-15 (×4): 20 ug via INTRAVENOUS

## 2020-02-15 MED ORDER — VANCOMYCIN HCL 1000 MG IV SOLR
INTRAVENOUS | Status: DC | PRN
  Administered 2020-02-15: 2000 mg via TOPICAL

## 2020-02-15 MED ORDER — METHOCARBAMOL 500 MG PO TABS
ORAL_TABLET | ORAL | Status: AC
  Filled 2020-02-15: qty 1

## 2020-02-15 MED ORDER — ASPIRIN 325 MG PO TABS: 325.0000 mg | ORAL_TABLET | Freq: Two times a day (BID) | ORAL | 0 refills | Status: AC

## 2020-02-15 MED ORDER — SUGAMMADEX SODIUM 200 MG/2ML IV SOLN
INTRAVENOUS | Status: DC | PRN
  Administered 2020-02-15: 200 mg via INTRAVENOUS

## 2020-02-15 MED ORDER — MIDAZOLAM HCL 5 MG/5ML IJ SOLN
INTRAMUSCULAR | Status: DC | PRN
  Administered 2020-02-15: 2 mg via INTRAVENOUS

## 2020-02-15 MED ORDER — PROPOFOL 10 MG/ML IV BOLUS
INTRAVENOUS | Status: AC
  Filled 2020-02-15: qty 20

## 2020-02-15 MED ORDER — ASPIRIN 325 MG PO TABS: 325.0000 mg | ORAL_TABLET | Freq: Two times a day (BID) | ORAL | Status: DC

## 2020-02-15 MED ORDER — ROCURONIUM BROMIDE 10 MG/ML (PF) SYRINGE
PREFILLED_SYRINGE | INTRAVENOUS | Status: AC
  Filled 2020-02-15: qty 10

## 2020-02-15 MED ORDER — CEFAZOLIN SODIUM 1 G IJ SOLR
INTRAMUSCULAR | Status: AC
  Filled 2020-02-15: qty 10

## 2020-02-15 MED ORDER — PHENYLEPHRINE 40 MCG/ML (10ML) SYRINGE FOR IV PUSH (FOR BLOOD PRESSURE SUPPORT)
PREFILLED_SYRINGE | INTRAVENOUS | Status: DC | PRN
  Administered 2020-02-15 (×3): 80 ug via INTRAVENOUS

## 2020-02-15 MED ORDER — MIDAZOLAM HCL 2 MG/2ML IJ SOLN
INTRAMUSCULAR | Status: AC
  Filled 2020-02-15: qty 2

## 2020-02-15 MED ORDER — DOXYCYCLINE HYCLATE 50 MG PO CAPS
100.0000 mg | ORAL_CAPSULE | Freq: Two times a day (BID) | ORAL | 0 refills | Status: AC
Start: 2020-02-15 — End: 2020-02-22

## 2020-02-15 MED ORDER — ACETAMINOPHEN 500 MG PO TABS: 1000.0000 mg | ORAL_TABLET | Freq: Four times a day (QID) | ORAL | 0 refills | Status: DC

## 2020-02-15 MED ORDER — CEFAZOLIN SODIUM-DEXTROSE 1-4 GM/50ML-% IV SOLN
INTRAVENOUS | Status: DC | PRN
Start: 2020-02-15 — End: 2020-02-15
  Administered 2020-02-15: 1 g via INTRAVENOUS

## 2020-02-15 MED ORDER — LIDOCAINE 2% (20 MG/ML) 5 ML SYRINGE
INTRAMUSCULAR | Status: DC | PRN
  Administered 2020-02-15: 60 mg via INTRAVENOUS

## 2020-02-15 MED ORDER — GLYCOPYRROLATE PF 0.2 MG/ML IJ SOSY
PREFILLED_SYRINGE | INTRAMUSCULAR | Status: DC | PRN
  Administered 2020-02-15: .1 mg via INTRAVENOUS

## 2020-02-15 MED ORDER — HYDROMORPHONE HCL 1 MG/ML IJ SOLN
INTRAMUSCULAR | Status: AC
  Filled 2020-02-15: qty 1

## 2020-02-15 MED ORDER — GLYCOPYRROLATE PF 0.2 MG/ML IJ SOSY
PREFILLED_SYRINGE | INTRAMUSCULAR | Status: AC
  Filled 2020-02-15: qty 1

## 2020-02-15 MED ORDER — ROCURONIUM BROMIDE 10 MG/ML (PF) SYRINGE
PREFILLED_SYRINGE | INTRAVENOUS | Status: DC | PRN
  Administered 2020-02-15: 50 mg via INTRAVENOUS

## 2020-02-15 MED ORDER — PROPOFOL 10 MG/ML IV BOLUS
INTRAVENOUS | Status: DC | PRN
  Administered 2020-02-15: 200 mg via INTRAVENOUS

## 2020-02-15 MED ORDER — VANCOMYCIN HCL 1000 MG IV SOLR
INTRAVENOUS | Status: AC
  Filled 2020-02-15: qty 2000

## 2020-02-15 MED ORDER — ONDANSETRON HCL 4 MG PO TABS: 4.0000 mg | ORAL_TABLET | Freq: Four times a day (QID) | ORAL | 0 refills | Status: DC | PRN

## 2020-02-15 MED ORDER — TOBRAMYCIN SULFATE 1.2 G IJ SOLR
INTRAMUSCULAR | Status: DC | PRN
  Administered 2020-02-15: 2.4 g via TOPICAL

## 2020-02-15 MED ORDER — OXYCODONE HCL 5 MG PO TABS: 5.0000 mg | ORAL_TABLET | ORAL | 0 refills | Status: DC | PRN

## 2020-02-15 MED ORDER — LORAZEPAM 0.5 MG PO TABS: 0.5000 mg | ORAL_TABLET | Freq: Four times a day (QID) | ORAL | 0 refills | Status: DC | PRN

## 2020-02-15 MED ORDER — TOBRAMYCIN SULFATE 1.2 G IJ SOLR
INTRAMUSCULAR | Status: AC
  Filled 2020-02-15: qty 2.4

## 2020-02-15 MED ORDER — 0.9 % SODIUM CHLORIDE (POUR BTL) OPTIME
TOPICAL | Status: DC | PRN
  Administered 2020-02-15: 1000 mL

## 2020-02-15 MED ORDER — GABAPENTIN 300 MG PO CAPS: 300.0000 mg | ORAL_CAPSULE | Freq: Three times a day (TID) | ORAL | 0 refills | Status: DC

## 2020-02-15 MED ORDER — FENTANYL CITRATE (PF) 250 MCG/5ML IJ SOLN
INTRAMUSCULAR | Status: AC
  Filled 2020-02-15: qty 5

## 2020-02-15 MED ORDER — DEXAMETHASONE SODIUM PHOSPHATE 10 MG/ML IJ SOLN
INTRAMUSCULAR | Status: AC
  Filled 2020-02-15: qty 1

## 2020-02-15 MED ORDER — LACTATED RINGERS IV SOLN: INTRAVENOUS | Status: DC | PRN

## 2020-02-15 MED ORDER — HYDROCODONE-ACETAMINOPHEN 7.5-325 MG PO TABS: 1.0000 | ORAL_TABLET | Freq: Once | ORAL | Status: DC | PRN

## 2020-02-15 MED ORDER — LIDOCAINE 2% (20 MG/ML) 5 ML SYRINGE
INTRAMUSCULAR | Status: AC
  Filled 2020-02-15: qty 5

## 2020-02-15 SURGICAL SUPPLY — 40 items
BNDG ELASTIC 4X5.8 VLCR STR LF (GAUZE/BANDAGES/DRESSINGS) IMPLANT
BNDG GAUZE ELAST 4 BULKY (GAUZE/BANDAGES/DRESSINGS) IMPLANT
CANISTER SUCT 3000ML PPV (MISCELLANEOUS) ×4 IMPLANT
CANISTER WOUND CARE 500ML ATS (WOUND CARE) ×4 IMPLANT
COVER SURGICAL LIGHT HANDLE (MISCELLANEOUS) ×4 IMPLANT
COVER WAND RF STERILE (DRAPES) ×4 IMPLANT
DRAPE HALF SHEET 40X57 (DRAPES) IMPLANT
DRAPE INCISE IOBAN 66X45 STRL (DRAPES) IMPLANT
DRAPE ORTHO SPLIT 77X108 STRL (DRAPES)
DRAPE SURG ORHT 6 SPLT 77X108 (DRAPES) IMPLANT
DRSG PAD ABDOMINAL 8X10 ST (GAUZE/BANDAGES/DRESSINGS) IMPLANT
DRSG VAC ATS LRG SENSATRAC (GAUZE/BANDAGES/DRESSINGS) IMPLANT
DRSG VAC ATS MED SENSATRAC (GAUZE/BANDAGES/DRESSINGS) IMPLANT
DRSG VAC ATS SM SENSATRAC (GAUZE/BANDAGES/DRESSINGS) IMPLANT
ELECT REM PT RETURN 9FT ADLT (ELECTROSURGICAL) ×4
ELECTRODE REM PT RTRN 9FT ADLT (ELECTROSURGICAL) ×2 IMPLANT
GAUZE SPONGE 4X4 12PLY STRL (GAUZE/BANDAGES/DRESSINGS) IMPLANT
GAUZE XEROFORM 5X9 LF (GAUZE/BANDAGES/DRESSINGS) ×4 IMPLANT
GLOVE BIO SURGEON STRL SZ 6.5 (GLOVE) ×9 IMPLANT
GLOVE BIO SURGEON STRL SZ7.5 (GLOVE) ×16 IMPLANT
GLOVE BIO SURGEONS STRL SZ 6.5 (GLOVE) ×3
GLOVE BIOGEL PI IND STRL 6.5 (GLOVE) ×2 IMPLANT
GLOVE BIOGEL PI IND STRL 7.5 (GLOVE) ×2 IMPLANT
GLOVE BIOGEL PI INDICATOR 6.5 (GLOVE) ×2
GLOVE BIOGEL PI INDICATOR 7.5 (GLOVE) ×2
GOWN STRL REUS W/ TWL LRG LVL3 (GOWN DISPOSABLE) ×4 IMPLANT
GOWN STRL REUS W/TWL LRG LVL3 (GOWN DISPOSABLE) ×4
KIT BASIN OR (CUSTOM PROCEDURE TRAY) ×4 IMPLANT
KIT DRSG PREVENA PLUS 7DAY 125 (MISCELLANEOUS) ×8 IMPLANT
KIT PREVENA INCISION MGT 13 (CANNISTER) ×4 IMPLANT
KIT PREVENA INCISION MGT20CM45 (CANNISTER) ×4 IMPLANT
KIT TURNOVER KIT B (KITS) ×4 IMPLANT
NS IRRIG 1000ML POUR BTL (IV SOLUTION) ×4 IMPLANT
PACK ORTHO EXTREMITY (CUSTOM PROCEDURE TRAY) ×4 IMPLANT
PAD ARMBOARD 7.5X6 YLW CONV (MISCELLANEOUS) ×4 IMPLANT
TOWEL GREEN STERILE (TOWEL DISPOSABLE) ×4 IMPLANT
TOWEL GREEN STERILE FF (TOWEL DISPOSABLE) ×4 IMPLANT
TUBE CONNECTING 12'X1/4 (SUCTIONS) ×1
TUBE CONNECTING 12X1/4 (SUCTIONS) ×3 IMPLANT
YANKAUER SUCT BULB TIP NO VENT (SUCTIONS) ×4 IMPLANT
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
## 2020-02-15 NOTE — Discharge Instructions (Signed)
Orthopaedic Trauma Service Discharge Instructions   General Discharge Instructions  WEIGHT BEARING STATUS: Nonweightbearing to left leg. Do not put weight on your leg until told to do so.  RANGE OF MOTION/ACTIVITY:Keep knee immobilizer in place until follow up appointment. This will help your wounds heal better  Wound Care: Keep incisional wound vacs on and charged. Do NOT get them wet. If you have any problems with them, please contact the office  DVT/PE prophylaxis: Aspirin for at least 30 days after surgery to prevent a blood clot  Diet: as you were eating previously.  Can use over the counter stool softeners and bowel preparations, such as Miralax, to help with bowel movements.  Narcotics can be constipating.  Be sure to drink plenty of fluids  PAIN MEDICATION USE AND EXPECTATIONS  You have likely been given narcotic medications to help control your pain.  After a traumatic event that results in an fracture (broken bone) with or without surgery, it is ok to use narcotic pain medications to help control one's pain.  We understand that everyone responds to pain differently and each individual patient will be evaluated on a regular basis for the continued need for narcotic medications. Ideally, narcotic medication use should last no more than 6-8 weeks (coinciding with fracture healing).   As a patient it is your responsibility as well to monitor narcotic medication use and report the amount and frequency you use these medications when you come to your office visit.   We would also advise that if you are using narcotic medications, you should take a dose prior to therapy to maximize you participation.  IF YOU ARE ON NARCOTIC MEDICATIONS IT IS NOT PERMISSIBLE TO OPERATE A MOTOR VEHICLE (MOTORCYCLE/CAR/TRUCK/MOPED) OR HEAVY MACHINERY DO NOT MIX NARCOTICS WITH OTHER CNS (CENTRAL NERVOUS SYSTEM) DEPRESSANTS SUCH AS ALCOHOL   STOP SMOKING OR USING NICOTINE PRODUCTS!!!!  As discussed nicotine  severely impairs your body's ability to heal surgical and traumatic wounds but also impairs bone healing.  Wounds and bone heal by forming microscopic blood vessels (angiogenesis) and nicotine is a vasoconstrictor (essentially, shrinks blood vessels).  Therefore, if vasoconstriction occurs to these microscopic blood vessels they essentially disappear and are unable to deliver necessary nutrients to the healing tissue.  This is one modifiable factor that you can do to dramatically increase your chances of healing your injury.    (This means no smoking, no nicotine gum, patches, etc)  ICE AND ELEVATE INJURED/OPERATIVE EXTREMITY  Using ice and elevating the injured extremity above your heart can help with swelling and pain control.  Icing in a pulsatile fashion, such as 20 minutes on and 20 minutes off, can be followed.    Do not place ice directly on skin. Make sure there is a barrier between to skin and the ice pack.    Using frozen items such as frozen peas works well as the conform nicely to the are that needs to be iced.  USE AN ACE WRAP OR TED HOSE FOR SWELLING CONTROL  In addition to icing and elevation, Ace wraps or TED hose are used to help limit and resolve swelling.  It is recommended to use Ace wraps or TED hose until you are informed to stop.    When using Ace Wraps start the wrapping distally (farthest away from the body) and wrap proximally (closer to the body)   Example: If you had surgery on your leg or thing and you do not have a splint on, start the ace  wrap at the toes and work your way up to the thigh        If you had surgery on your upper extremity and do not have a splint on, start the ace wrap at your fingers and work your way up to the upper arm  IF YOU ARE IN A SPLINT OR CAST DO NOT REMOVE IT FOR ANY REASON   If your splint gets wet for any reason please contact the office immediately. You may shower in your splint or cast as long as you keep it dry.  This can be done by  wrapping in a cast cover or garbage back (or similar)  Do Not stick any thing down your splint or cast such as pencils, money, or hangers to try and scratch yourself with.  If you feel itchy take benadryl as prescribed on the bottle for itching  IF YOU ARE IN A CAM BOOT (BLACK BOOT)  You may remove boot periodically. Perform daily dressing changes as noted below.  Wash the liner of the boot regularly and wear a sock when wearing the boot. It is recommended that you sleep in the boot until told otherwise   CALL THE OFFICE WITH ANY QUESTIONS OR CONCERNS: [phone]   VISIT OUR WEBSITE FOR ADDITIONAL INFORMATION: [email or web address]

## 2020-02-15 NOTE — Progress Notes (Signed)
0945 Received pt from PACU, A&O x4. Left lower leg and left heel wound vac dressing dry and intact, Prevena wound vacs charging. Pain meds given as needed.

## 2020-02-15 NOTE — TOC CAGE-AID Note (Signed)
Transition of Care [facility]) - CAGE-AID Screening   Patient Details  Name: Micheal Bryant MRN: [id number] Date of Birth: [birth year]  Transition of Care [facility]) CM/SW Contact:    Jimmy Picket, LCSWA Phone Number: 02/15/2020, 11:39 AM   Clinical Narrative:  Pt denied alcohol use and reports occasional THC use. Pt was receptive to counseling but denied resources.   CAGE-AID Screening:    Have You Ever Felt You Ought to Cut Down on Your Drinking or Drug Use?: No Have People Annoyed You By Critizing Your Drinking Or Drug Use?: No Have You Felt Bad Or Guilty About Your Drinking Or Drug Use?: No Have You Ever Had a Drink or Used Drugs First Thing In The Morning to STeady Your Nerves or to Get Rid of a Hangover?: No CAGE-AID Score: 0  Substance Abuse Education Offered: Yes  Substance abuse interventions: Patient Counseling  Jimmy Picket, Theresia Majors, [place] Clinical Social Worker [phone]
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
## 2020-02-15 NOTE — Progress Notes (Signed)
Ortho Trauma Note  Patient doing well.  He is ready to go home.  Pain has been well controlled.  No major issues.  Discussed risks and benefits of surgical intervention including risk of bleeding, infection, need for soft tissue coverage.  The patient agrees to proceed with surgery and consent was obtained.  Possible discharge home today versus tomorrow depending on intraoperative findings.  Roby Lofts, MD [facility] [phone] (office) [email or web address]
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
## 2020-02-15 NOTE — Progress Notes (Signed)
Pt is ambulating with walker today, Prevena wound vac to left heel and left knee dry and intact. Discharge instructions given to pt and mother with Prevena instructions/teachings. Discharged to home accompanied by mother.

## 2020-02-15 NOTE — Transfer of Care (Signed)
Immediate Anesthesia Transfer of Care Note  Patient: Micheal Bryant  Procedure(s) Performed: IRRIGATION AND DEBRIDEMENT KNEE (Left Knee) APPLICATION OF WOUND VAC (Left )  Patient Location: PACU  Anesthesia Type:General  Level of Consciousness: drowsy  Airway & Oxygen Therapy: Patient Spontanous Breathing and Patient connected to nasal cannula oxygen  Post-op Assessment: Report given to RN and Post -op Vital signs reviewed and stable  Post vital signs: Reviewed and stable  Last Vitals:  Vitals Value Taken Time  BP 122/73 02/15/20 0907  Temp    Pulse 79 02/15/20 0908  Resp 15 02/15/20 0908  SpO2 100 % 02/15/20 0908  Vitals shown include unvalidated device data.  Last Pain:  Vitals:   02/15/20 0642  TempSrc:   PainSc: Asleep      Patients Stated Pain Goal: 0 (02/13/20 1333)  Complications: No complications documented.
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
## 2020-02-15 NOTE — Anesthesia Procedure Notes (Signed)
Procedure Name: Intubation Date/Time: 02/15/2020 7:35 AM Performed by: Orlie Dakin, CRNA Pre-anesthesia Checklist: Patient identified, Emergency Drugs available, Suction available and Patient being monitored Oxygen Delivery Method: Circle system utilized Preoxygenation: Pre-oxygenation with 100% oxygen Induction Type: IV induction Ventilation: Mask ventilation without difficulty Laryngoscope Size: Mac and 4 Grade View: Grade I Tube type: Oral Tube size: 7.5 mm Number of attempts: 1 Airway Equipment and Method: Stylet Placement Confirmation: ETT inserted through vocal cords under direct vision,  positive ETCO2 and breath sounds checked- equal and bilateral Secured at: 23 cm Tube secured with: Tape Dental Injury: Teeth and Oropharynx as per pre-operative assessment  Comments: 4x4s bite block used.

## 2020-02-15 NOTE — Progress Notes (Signed)
OT Cancellation Note  Patient Details Name: Micheal Bryant MRN: [id number] DOB: [birth year]   Cancelled Treatment:    Reason Eval/Treat Not Completed: Patient at procedure or test/ unavailable. Patient off floor at repeat I&D. OT will continue efforts toward established plan of care when patient is available.   Kallie Edward OTR/L Supplemental OT, Department of rehab services [phone]  Elisia Stepp R H. 02/15/2020, 7:40 AM

## 2020-02-15 NOTE — Anesthesia Postprocedure Evaluation (Signed)
Anesthesia Post Note  Patient: Micheal Bryant  Procedure(s) Performed: IRRIGATION AND DEBRIDEMENT KNEE (Left Knee) APPLICATION OF WOUND VAC (Left )     Patient location during evaluation: PACU Anesthesia Type: General Level of consciousness: awake and alert and oriented Pain management: pain level controlled Vital Signs Assessment: post-procedure vital signs reviewed and stable Respiratory status: spontaneous breathing, nonlabored ventilation and respiratory function stable Cardiovascular status: blood pressure returned to baseline and stable Postop Assessment: no apparent nausea or vomiting Anesthetic complications: no   No complications documented.  Last Vitals:  Vitals:   02/15/20 0937 02/15/20 0947  BP: 107/66 109/70  Pulse: 75 72  Resp: 16 18  Temp: 36.5 C 37 C  SpO2: 100% 100%    Last Pain:  Vitals:   02/15/20 0937  TempSrc:   PainSc: Asleep                 Ravon Mcilhenny A.

## 2020-02-16 ENCOUNTER — Encounter (HOSPITAL_COMMUNITY): Payer: Self-pay | Admitting: Student

## 2020-04-08 ENCOUNTER — Encounter (HOSPITAL_COMMUNITY): Payer: Self-pay

## 2020-04-08 ENCOUNTER — Emergency Department (HOSPITAL_COMMUNITY)
Admission: EM | Admit: 2020-04-08 | Discharge: 2020-04-08 | Disposition: A | Discharge status: Home / Self Care | Payer: Self-pay | Attending: Emergency Medicine | Admitting: Emergency Medicine

## 2020-04-08 DIAGNOSIS — F17200 Nicotine dependence, unspecified, uncomplicated: Secondary | ICD-10-CM | POA: Insufficient documentation

## 2020-04-08 DIAGNOSIS — R000 Tachycardia, unspecified: Secondary | ICD-10-CM | POA: Insufficient documentation

## 2020-04-08 DIAGNOSIS — R002 Palpitations: Secondary | ICD-10-CM | POA: Insufficient documentation

## 2020-04-08 DIAGNOSIS — M25562 Pain in left knee: Secondary | ICD-10-CM | POA: Insufficient documentation

## 2020-04-08 LAB — CBC WITH DIFFERENTIAL/PLATELET
Abs Immature Granulocytes: 0.03 10*3/uL (ref 0.00–0.07)
Basophils Absolute: 0.1 10*3/uL (ref 0.0–0.1)
Basophils Relative: 1 %
Eosinophils Absolute: 0 10*3/uL (ref 0.0–0.5)
Eosinophils Relative: 0 %
HCT: 39.8 % (ref 39.0–52.0)
Hemoglobin: 12.6 g/dL — ABNORMAL LOW (ref 13.0–17.0)
Immature Granulocytes: 0 %
Lymphocytes Relative: 15 %
Lymphs Abs: 1.3 10*3/uL (ref 0.7–4.0)
MCH: 27.6 pg (ref 26.0–34.0)
MCHC: 31.7 g/dL (ref 30.0–36.0)
MCV: 87.3 fL (ref 80.0–100.0)
Monocytes Absolute: 0.5 10*3/uL (ref 0.1–1.0)
Monocytes Relative: 6 %
Neutro Abs: 6.8 10*3/uL (ref 1.7–7.7)
Neutrophils Relative %: 78 %
Platelets: 367 10*3/uL (ref 150–400)
RBC: 4.56 MIL/uL (ref 4.22–5.81)
RDW: 13.4 % (ref 11.5–15.5)
WBC: 8.7 10*3/uL (ref 4.0–10.5)
nRBC: 0 % (ref 0.0–0.2)

## 2020-04-08 LAB — COMPREHENSIVE METABOLIC PANEL
ALT: 21 U/L (ref 0–44)
AST: 29 U/L (ref 15–41)
Albumin: 4.6 g/dL (ref 3.5–5.0)
Alkaline Phosphatase: 43 U/L (ref 38–126)
Anion gap: 12 (ref 5–15)
BUN: 9 mg/dL (ref 6–20)
CO2: 23 mmol/L (ref 22–32)
Calcium: 9.9 mg/dL (ref 8.9–10.3)
Chloride: 104 mmol/L (ref 98–111)
Creatinine, Ser: 1.17 mg/dL (ref 0.61–1.24)
GFR calc Af Amer: >60 mL/min (ref >60)
GFR calc non Af Amer: >60 mL/min (ref >60)
Glucose, Bld: 104 mg/dL — ABNORMAL HIGH (ref 70–99)
Potassium: 3.5 mmol/L (ref 3.5–5.1)
Sodium: 139 mmol/L (ref 135–145)
Total Bilirubin: 0.4 mg/dL (ref 0.3–1.2)
Total Protein: 8.3 g/dL — ABNORMAL HIGH (ref 6.5–8.1)

## 2020-04-08 LAB — ACETAMINOPHEN LEVEL: Acetaminophen (Tylenol), Serum: <10 ug/mL — ABNORMAL LOW (ref 10–30)

## 2020-04-08 LAB — ETHANOL: Alcohol, Ethyl (B): <10 mg/dL (ref <10)

## 2020-04-08 LAB — SALICYLATE LEVEL: Salicylate Lvl: <7 mg/dL — ABNORMAL LOW (ref 7.0–30.0)

## 2020-04-08 MED ORDER — LORAZEPAM 2 MG/ML IJ SOLN
1.0000 mg | Freq: Once | INTRAMUSCULAR | Status: AC
  Administered 2020-04-08: 1 mg via INTRAVENOUS
  Filled 2020-04-08: qty 1

## 2020-04-08 NOTE — ED Notes (Signed)
Pt has decided to stay for treatment.  Pt is concerned because he has not informed his mother.

## 2020-04-08 NOTE — ED Notes (Signed)
Spoke with [place] with poison control. States observation for 6 hours repeat EKG in 4 hours. Benzos if needed for tachycardia

## 2020-04-08 NOTE — ED Notes (Signed)
Pt pulled off all of his monitoring and bp cuff, pulling tape from IV.   IV site dc'd prior to pt leaving.

## 2020-04-08 NOTE — ED Notes (Signed)
Denies drinking ETOH or any other drugs.

## 2020-04-08 NOTE — ED Notes (Signed)
Pt denies SI.  Pt says he smoked THC and took 10 Benadryl and  was playing video.  Pt is very anxious and constantly looking at cardiac monitor during assessment.  Informed of what the monitor is showing and that we are going to monitor him closely.

## 2020-04-08 NOTE — ED Triage Notes (Signed)
Pt was brought in by EMS due to calling 911 for chest pain. Pt admitted to EMS he smoked weed and his heart was beating fast. Pt paranoid. Pt tells triage nurse that he took apporx 10 benadryl apprx 1400 to help him sleep. Denies SI

## 2020-04-08 NOTE — ED Notes (Signed)
Pt is wanting to leave AMA.  Pauline Aus, PA notified.
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
## 2020-04-22 ENCOUNTER — Other Ambulatory Visit: Payer: Self-pay

## 2020-04-22 ENCOUNTER — Encounter (HOSPITAL_COMMUNITY): Payer: Self-pay | Admitting: Physical Therapy

## 2020-04-22 ENCOUNTER — Ambulatory Visit (HOSPITAL_COMMUNITY): Payer: Self-pay | Attending: Student | Admitting: Physical Therapy

## 2020-04-22 DIAGNOSIS — M6281 Muscle weakness (generalized): Secondary | ICD-10-CM | POA: Insufficient documentation

## 2020-04-22 DIAGNOSIS — M25662 Stiffness of left knee, not elsewhere classified: Secondary | ICD-10-CM | POA: Insufficient documentation

## 2020-04-22 DIAGNOSIS — R2689 Other abnormalities of gait and mobility: Secondary | ICD-10-CM | POA: Insufficient documentation
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
## 2020-04-22 NOTE — Patient Instructions (Signed)
Heel Slide    Bend knee and pull heel toward buttocks. Hold 3__ seconds. Return. Repeat with other knee. Repeat __10__ times. Do _1-2___ sessions per day.  http://[email or web address].exer.us/371   Copyright  VHI. All rights reserved.  Hip Flexion / Knee Extension: Straight-Leg Raise (Eccentric)    Lie on back. Lift leg with knee straight. Slowly lower leg for 3-5 seconds. _10__ reps per set, _1-2__ sets per day, _7__ days per week.  Copyright  VHI. All rights reserved.

## 2020-04-25 ENCOUNTER — Ambulatory Visit (HOSPITAL_COMMUNITY): Payer: Self-pay | Admitting: Physical Therapy

## 2020-04-26 ENCOUNTER — Encounter (HOSPITAL_COMMUNITY): Payer: Self-pay

## 2020-04-28 ENCOUNTER — Other Ambulatory Visit: Payer: Self-pay

## 2020-04-28 ENCOUNTER — Encounter (HOSPITAL_COMMUNITY): Payer: Self-pay

## 2020-04-28 ENCOUNTER — Ambulatory Visit (HOSPITAL_COMMUNITY): Payer: Self-pay | Attending: Student

## 2020-04-28 DIAGNOSIS — M25662 Stiffness of left knee, not elsewhere classified: Secondary | ICD-10-CM | POA: Insufficient documentation

## 2020-04-28 DIAGNOSIS — M6281 Muscle weakness (generalized): Secondary | ICD-10-CM | POA: Insufficient documentation

## 2020-04-28 DIAGNOSIS — R2689 Other abnormalities of gait and mobility: Secondary | ICD-10-CM | POA: Insufficient documentation
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
## 2020-04-28 NOTE — Patient Instructions (Signed)
Knee / Mills Koller on stomach, knees together. Grab one ankle with same side hand. Use towel if needed to reach. Gently pull foot toward buttock. Hold 30 seconds. Repeat with other leg. Repeat 3 times. Do 2 sessions per day.  Copyright  VHI. All rights reserved.   Straight Leg Raise (Prone)    Abdomen and head supported, keep left knee locked and raise leg at hip. Avoid arching low back. Repeat 10 times per set. Do 2 sets per session.  http://orth.exer.us/1112   Copyright  VHI. All rights reserved.   Functional Quadriceps: Sit to Stand    Sit on edge of chair, feet flat on floor. Stand upright, extending knees fully. Repeat 10 times per set. Do 2 sets per session.   http://orth.exer.us/734   Copyright  VHI. All rights reserved.

## 2020-05-04 ENCOUNTER — Telehealth (HOSPITAL_COMMUNITY): Payer: Self-pay | Admitting: Physical Therapy

## 2020-05-04 ENCOUNTER — Ambulatory Visit (HOSPITAL_COMMUNITY): Payer: Self-pay | Admitting: Physical Therapy

## 2020-05-04 NOTE — Telephone Encounter (Signed)
Patient no show #1. Called patient to follow up on missed appointment but voicemail was not set up on mobile phone and no answer on home phone.  9:35 AM, 05/04/20 Wyman Songster PT, DPT Physical Therapist at [facility]

## 2020-05-06 ENCOUNTER — Encounter (HOSPITAL_COMMUNITY): Payer: Self-pay | Admitting: *Deleted

## 2020-05-06 ENCOUNTER — Other Ambulatory Visit: Payer: Self-pay

## 2020-05-06 ENCOUNTER — Emergency Department (HOSPITAL_COMMUNITY)
Admission: EM | Admit: 2020-05-06 | Discharge: 2020-05-06 | Disposition: A | Discharge status: Home / Self Care | Payer: Self-pay | Attending: Emergency Medicine | Admitting: Emergency Medicine

## 2020-05-06 DIAGNOSIS — Z5321 Procedure and treatment not carried out due to patient leaving prior to being seen by health care provider: Secondary | ICD-10-CM | POA: Insufficient documentation

## 2020-05-06 DIAGNOSIS — R079 Chest pain, unspecified: Secondary | ICD-10-CM | POA: Insufficient documentation

## 2020-05-06 LAB — BASIC METABOLIC PANEL
Anion gap: 10 (ref 5–15)
BUN: 11 mg/dL (ref 6–20)
CO2: 23 mmol/L (ref 22–32)
Calcium: 9.8 mg/dL (ref 8.9–10.3)
Chloride: 104 mmol/L (ref 98–111)
Creatinine, Ser: 1.28 mg/dL — ABNORMAL HIGH (ref 0.61–1.24)
GFR calc Af Amer: >60 mL/min (ref >60)
GFR calc non Af Amer: >60 mL/min (ref >60)
Glucose, Bld: 111 mg/dL — ABNORMAL HIGH (ref 70–99)
Potassium: 3.8 mmol/L (ref 3.5–5.1)
Sodium: 137 mmol/L (ref 135–145)

## 2020-05-06 LAB — CBC
HCT: 40.7 % (ref 39.0–52.0)
Hemoglobin: 12.3 g/dL — ABNORMAL LOW (ref 13.0–17.0)
MCH: 25.9 pg — ABNORMAL LOW (ref 26.0–34.0)
MCHC: 30.2 g/dL (ref 30.0–36.0)
MCV: 85.7 fL (ref 80.0–100.0)
Platelets: 329 10*3/uL (ref 150–400)
RBC: 4.75 MIL/uL (ref 4.22–5.81)
RDW: 14.3 % (ref 11.5–15.5)
WBC: 7.4 10*3/uL (ref 4.0–10.5)
nRBC: 0 % (ref 0.0–0.2)

## 2020-05-06 LAB — TROPONIN I (HIGH SENSITIVITY): Troponin I (High Sensitivity): 3 ng/L (ref <18)

## 2020-05-06 NOTE — ED Triage Notes (Signed)
The pt is c/o chest pain for 2 hours no pervious history no known injury

## 2020-05-06 NOTE — ED Notes (Signed)
Pt called for vitals x3 with no response.  

## 2020-05-06 NOTE — ED Notes (Signed)
Pt refused to have his Xray taken.

## 2020-05-10 ENCOUNTER — Telehealth (HOSPITAL_COMMUNITY): Payer: Self-pay

## 2020-05-10 ENCOUNTER — Ambulatory Visit (HOSPITAL_COMMUNITY): Payer: Self-pay

## 2020-05-10 NOTE — Telephone Encounter (Signed)
No show, called and left message concerning missed apt today.  Reminded next apt date and time with contact information included.  Also included no show policy details in message.  Becky Sax, LPTA/CLT; [facility] [place] [phone]

## 2020-05-12 ENCOUNTER — Telehealth (HOSPITAL_COMMUNITY): Payer: Self-pay | Admitting: Physical Therapy

## 2020-05-12 ENCOUNTER — Ambulatory Visit (HOSPITAL_COMMUNITY): Payer: Self-pay | Admitting: Physical Therapy

## 2020-05-12 NOTE — Telephone Encounter (Signed)
Called regarding patient not showing this date. Patient's mother reported that they did not realize patient had an appointment, but that he will be at the next one. Reminded of next appointment day and time.  Verne Carrow PT, DPT 9:03 AM, 05/12/20 [phone]

## 2020-05-16 ENCOUNTER — Encounter (HOSPITAL_COMMUNITY): Payer: Self-pay | Admitting: Physical Therapy

## 2020-05-17 ENCOUNTER — Encounter (HOSPITAL_COMMUNITY): Payer: Self-pay | Admitting: Physical Therapy

## 2020-05-17 ENCOUNTER — Telehealth (HOSPITAL_COMMUNITY): Payer: Self-pay | Admitting: Physical Therapy

## 2020-05-17 NOTE — Therapy (Signed)
[place], [place], [address] Phone: [phone]   Fax:  [phone]  Patient Details  Name: Micheal Bryant MRN: [id number] Date of Birth: 08-18-1996 Referring Provider:  No ref. provider found  Encounter Date: 05/17/2020  PHYSICAL THERAPY DISCHARGE SUMMARY  Visits from Start of Care: 2   Current functional level related to goals / functional outcomes: Unknown as patient did not return to physical therapy for re-assessment   Remaining deficits: Unknown as patient did not return to physical therapy for re-assessment    Education / Equipment: HEP Plan: Patient agrees to discharge.  Patient goals were not met. Patient is being discharged due to                                                     ?????    Attendance policy: 3 no-shows to therapy sessions   Clarene Critchley PT, DPT 11:59 AM, 05/17/20 [place], [place], [address] Phone: [phone]   Fax:  [phone]

## 2020-05-17 NOTE — Telephone Encounter (Signed)
Therapist called due to patient not showing for therapy this date. Discussed that patient will be discharged from physical therapy due to the attendance policy and patient has had 3 no-shows to therapy. Explained that should the patient wish to return to therapy he will need a new referral from his physician.   Verne Carrow PT, DPT 11:56 AM, 05/17/20 [phone]

## 2020-05-19 ENCOUNTER — Encounter (HOSPITAL_COMMUNITY): Payer: Self-pay | Admitting: Physical Therapy

## 2020-05-24 ENCOUNTER — Encounter (HOSPITAL_COMMUNITY): Payer: Self-pay | Admitting: Physical Therapy

## 2020-05-26 ENCOUNTER — Encounter (HOSPITAL_COMMUNITY): Payer: Self-pay | Admitting: Physical Therapy

## 2021-03-10 ENCOUNTER — Encounter (HOSPITAL_COMMUNITY): Payer: Self-pay | Admitting: Emergency Medicine

## 2021-03-10 ENCOUNTER — Other Ambulatory Visit: Payer: Self-pay

## 2021-03-10 ENCOUNTER — Emergency Department (HOSPITAL_COMMUNITY)
Admission: EM | Admit: 2021-03-10 | Discharge: 2021-03-10 | Disposition: A | Discharge status: Home / Self Care | Payer: Self-pay | Attending: Emergency Medicine | Admitting: Emergency Medicine

## 2021-03-10 DIAGNOSIS — F17210 Nicotine dependence, cigarettes, uncomplicated: Secondary | ICD-10-CM | POA: Insufficient documentation

## 2021-03-10 DIAGNOSIS — R000 Tachycardia, unspecified: Secondary | ICD-10-CM | POA: Insufficient documentation

## 2021-03-10 DIAGNOSIS — F1210 Cannabis abuse, uncomplicated: Secondary | ICD-10-CM | POA: Insufficient documentation

## 2021-03-10 DIAGNOSIS — F419 Anxiety disorder, unspecified: Secondary | ICD-10-CM | POA: Insufficient documentation

## 2021-03-10 DIAGNOSIS — F1910 Other psychoactive substance abuse, uncomplicated: Secondary | ICD-10-CM

## 2021-03-10 DIAGNOSIS — Z91010 Allergy to peanuts: Secondary | ICD-10-CM | POA: Insufficient documentation

## 2021-03-10 LAB — CBC WITH DIFFERENTIAL/PLATELET
Abs Immature Granulocytes: 0.02 10*3/uL (ref 0.00–0.07)
Basophils Absolute: 0.1 10*3/uL (ref 0.0–0.1)
Basophils Relative: 1 %
Eosinophils Absolute: 0.1 10*3/uL (ref 0.0–0.5)
Eosinophils Relative: 2 %
HCT: 38.3 % — ABNORMAL LOW (ref 39.0–52.0)
Hemoglobin: 12.9 g/dL — ABNORMAL LOW (ref 13.0–17.0)
Immature Granulocytes: 0 %
Lymphocytes Relative: 16 %
Lymphs Abs: 1.3 10*3/uL (ref 0.7–4.0)
MCH: 31.6 pg (ref 26.0–34.0)
MCHC: 33.7 g/dL (ref 30.0–36.0)
MCV: 93.9 fL (ref 80.0–100.0)
Monocytes Absolute: 0.7 10*3/uL (ref 0.1–1.0)
Monocytes Relative: 8 %
Neutro Abs: 6.1 10*3/uL (ref 1.7–7.7)
Neutrophils Relative %: 73 %
Platelets: 257 10*3/uL (ref 150–400)
RBC: 4.08 MIL/uL — ABNORMAL LOW (ref 4.22–5.81)
RDW: 13.9 % (ref 11.5–15.5)
WBC: 8.3 10*3/uL (ref 4.0–10.5)
nRBC: 0 % (ref 0.0–0.2)

## 2021-03-10 LAB — COMPREHENSIVE METABOLIC PANEL
ALT: 20 U/L (ref 0–44)
AST: 27 U/L (ref 15–41)
Albumin: 4.3 g/dL (ref 3.5–5.0)
Alkaline Phosphatase: 38 U/L (ref 38–126)
Anion gap: 8 (ref 5–15)
BUN: 14 mg/dL (ref 6–20)
CO2: 23 mmol/L (ref 22–32)
Calcium: 8.7 mg/dL — ABNORMAL LOW (ref 8.9–10.3)
Chloride: 103 mmol/L (ref 98–111)
Creatinine, Ser: 1.35 mg/dL — ABNORMAL HIGH (ref 0.61–1.24)
GFR, Estimated: >60 mL/min (ref >60)
Glucose, Bld: 128 mg/dL — ABNORMAL HIGH (ref 70–99)
Potassium: 3.2 mmol/L — ABNORMAL LOW (ref 3.5–5.1)
Sodium: 134 mmol/L — ABNORMAL LOW (ref 135–145)
Total Bilirubin: 0.8 mg/dL (ref 0.3–1.2)
Total Protein: 7.2 g/dL (ref 6.5–8.1)

## 2021-03-10 MED ORDER — LORAZEPAM 2 MG/ML IJ SOLN
2.0000 mg | Freq: Once | INTRAMUSCULAR | Status: AC
  Administered 2021-03-10: 2 mg via INTRAVENOUS
  Filled 2021-03-10: qty 1

## 2021-03-10 MED ORDER — SODIUM CHLORIDE 0.9 % IV BOLUS
1000.0000 mL | Freq: Once | INTRAVENOUS | Status: AC
  Administered 2021-03-10: 1000 mL via INTRAVENOUS
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
## 2021-03-10 NOTE — ED Notes (Signed)
Pt reports taking "5 or 6 25mg  Benadryl" prior to calling EMS

## 2021-03-10 NOTE — Discharge Instructions (Addendum)
Stop using the marijuana Drink lots of fluids

## 2021-03-10 NOTE — ED Triage Notes (Signed)
Pt reports smoking "bad marijuana" earlier today and began having extreme anxiety and panic. HR elevated during transport to 150 bpm.

## 2021-11-04 ENCOUNTER — Other Ambulatory Visit: Payer: Self-pay

## 2021-11-04 ENCOUNTER — Emergency Department (HOSPITAL_COMMUNITY)
Admission: EM | Admit: 2021-11-04 | Discharge: 2021-11-04 | Disposition: A | Discharge status: Home / Self Care | Payer: Self-pay | Attending: Emergency Medicine | Admitting: Emergency Medicine

## 2021-11-04 ENCOUNTER — Encounter (HOSPITAL_COMMUNITY): Payer: Self-pay | Admitting: Emergency Medicine

## 2021-11-04 DIAGNOSIS — K047 Periapical abscess without sinus: Secondary | ICD-10-CM

## 2021-11-04 DIAGNOSIS — K029 Dental caries, unspecified: Secondary | ICD-10-CM | POA: Insufficient documentation

## 2021-11-04 DIAGNOSIS — Z91010 Allergy to peanuts: Secondary | ICD-10-CM | POA: Insufficient documentation

## 2021-11-04 MED ORDER — AMOXICILLIN-POT CLAVULANATE 875-125 MG PO TABS
1.0000 | ORAL_TABLET | Freq: Once | ORAL | Status: AC
  Administered 2021-11-04: 1 via ORAL
  Filled 2021-11-04: qty 1

## 2021-11-04 MED ORDER — BENZOCAINE 10 % MT GEL: 1.0000 "application " | OROMUCOSAL | 0 refills | Status: DC | PRN

## 2021-11-04 MED ORDER — AMOXICILLIN-POT CLAVULANATE 875-125 MG PO TABS: 1.0000 | ORAL_TABLET | Freq: Two times a day (BID) | ORAL | 0 refills | Status: DC

## 2021-11-04 MED ORDER — ACETAMINOPHEN 500 MG PO TABS
1000.0000 mg | ORAL_TABLET | Freq: Once | ORAL | Status: AC
  Administered 2021-11-04: 1000 mg via ORAL
  Filled 2021-11-04: qty 2

## 2021-11-04 NOTE — ED Triage Notes (Addendum)
Patient c/o left lower dental pain. Per patient is to have wisdom tooth pulled but has been unable to as of yet. Patient taking aspirin with some relief. Per patient last took aspirin yesterday. Patient unsure about fevers but does report having chills. Patient afebrile at this time.  ?
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
## 2021-11-04 NOTE — Discharge Instructions (Signed)
Please take entire course of antibiotics as directed.  Continue using ibuprofen / Tylenol, as well as prescribed Orajel for pain.  You will need to follow-up with your dentist for continued management of this. Please see dental resources below. Return to the emergency department for fevers, swelling or pain under the tongue or in the neck, difficulty breathing or swallowing, nausea or vomiting that does not stop, or any other new or concerning symptoms.   

## 2021-12-10 ENCOUNTER — Other Ambulatory Visit: Payer: Self-pay

## 2021-12-10 ENCOUNTER — Encounter (HOSPITAL_COMMUNITY): Payer: Self-pay

## 2021-12-10 ENCOUNTER — Emergency Department (HOSPITAL_COMMUNITY)
Admission: EM | Admit: 2021-12-10 | Discharge: 2021-12-10 | Disposition: A | Discharge status: Home / Self Care | Payer: Self-pay | Attending: Emergency Medicine | Admitting: Emergency Medicine

## 2021-12-10 DIAGNOSIS — K0889 Other specified disorders of teeth and supporting structures: Secondary | ICD-10-CM | POA: Insufficient documentation

## 2021-12-10 DIAGNOSIS — Z91010 Allergy to peanuts: Secondary | ICD-10-CM | POA: Insufficient documentation

## 2021-12-10 MED ORDER — PENICILLIN V POTASSIUM 500 MG PO TABS: 500.0000 mg | ORAL_TABLET | Freq: Four times a day (QID) | ORAL | 0 refills | Status: AC

## 2021-12-10 NOTE — ED Provider Notes (Signed)
?[facility] ?Provider Note ? ? ?CSN: [id number] ?Arrival date & time: 12/10/21  1432 ? ?  ? ?History ? ?Chief Complaint  ?Patient presents with  ? Dental Pain  ? ? ?Micheal Bryant is a 26 y.o. male. ? ?HPI ? ?Patient without significant medical history presents with complaints of left bottom dental pain.  Patient states that he believes he chipped his tooth about 2 months ago and ever since been having pain in that area.  He states the last couple days pain has gotten worse, states pain is constant does not radiate, denies any face swelling tongue swelling throat swelling difficulty swallowing or breathing no systemic effects like fevers or chills chest pain or shortness of breath.  He is not seen just in a long time, states he just got insurance to go to follow-up with a dentist.  He has no other complaints. ? ?Home Medications ?Prior to Admission medications   ?Medication Sig Start Date End Date Taking? Authorizing Provider  ?acetaminophen (TYLENOL) 500 MG tablet Take 2 tablets (1,000 mg total) by mouth every 6 (six) hours. ?Patient taking differently: Take 1,000 mg by mouth every 6 (six) hours as needed for moderate pain. 02/15/20  Yes Haddix, Thomasene Lot, MD  ?penicillin v potassium (VEETID) 500 MG tablet Take 1 tablet (500 mg total) by mouth 4 (four) times daily for 7 days. 12/10/21 12/17/21 Yes Marcello Fennel, PA-C  ?ibuprofen (ADVIL,MOTRIN) 200 MG tablet Take 200 mg by mouth every 6 (six) hours as needed. Pain  ?Patient not taking: Reported on 12/10/2021    [provider]  ?   ? ?Allergies    ?Fish allergy and Peanut-containing drug products   ? ?Review of Systems   ?Review of Systems  ?Constitutional:  Negative for chills and fever.  ?HENT:  Positive for dental problem.   ?Respiratory:  Negative for shortness of breath.   ?Cardiovascular:  Negative for chest pain.  ?Gastrointestinal:  Negative for abdominal pain.  ?Neurological:  Negative for headaches.  ? ?Physical  Exam ?Updated Vital Signs ?BP 119/69 (BP Location: Right Arm)   Pulse 98   Temp 98.5 ?F (36.9 ?C) (Oral)   Resp 17   Ht 6\' 4"  (1.93 m)   Wt 86.2 kg   SpO2 99%   BMI 23.13 kg/m?  ?Physical Exam ?Vitals and nursing note reviewed.  ?Constitutional:   ?   General: He is not in acute distress. ?   Appearance: He is not ill-appearing.  ?HENT:  ?   Head: Normocephalic and atraumatic.  ?   Comments: No facial swelling or overlying skin changes seen on patient's exam. ?   Nose: No congestion.  ?   Mouth/Throat:  ?   Comments: No trismus no torticollis tongue and uvula both midline, controlling oral secretions tonsils equal symmetric bilaterally no tongue elevation no submandibular swelling. ? ?Poor dental hygiene left bottom molars are decaying away, he has some erythema around the gum lines but no palpable fluctuance or induration present. ?Eyes:  ?   Extraocular Movements: Extraocular movements intact.  ?   Conjunctiva/sclera: Conjunctivae normal.  ?Cardiovascular:  ?   Rate and Rhythm: Normal rate and regular rhythm.  ?   Pulses: Normal pulses.  ?   Heart sounds: No murmur heard. ?  No friction rub. No gallop.  ?Pulmonary:  ?   Effort: No respiratory distress.  ?   Breath sounds: No wheezing, rhonchi or rales.  ?Skin: ?   General: Skin is warm  and dry.  ?Neurological:  ?   Mental Status: He is alert.  ?Psychiatric:     ?   Mood and Affect: Mood normal.  ? ? ?ED Results / Procedures / Treatments   ?Labs ?(all labs ordered are listed, but only abnormal results are displayed) ?Labs Reviewed - No data to display ? ?EKG ?None ? ?Radiology ?No results found. ? ?Procedures ?Procedures  ? ? ?Medications Ordered in ED ?Medications - No data to display ? ?ED Course/ Medical Decision Making/ A&P ?  ?                        ?Medical Decision Making ? ?This patient presents to the ED for concern of dental pain, this involves an extensive number of treatment options, and is a complaint that carries with it a high risk of  complications and morbidity.  The differential diagnosis includes Ludwig angina, peritonsillar or retropharyngeal abscess ? ? ? ?Additional history obtained: ? ?Additional history obtained from N/A ?External records from outside source obtained and reviewed including imaging and lab work ? ? ?Co morbidities that complicate the patient evaluation ? ?N/A ? ?Social Determinants of Health: ? ?Patient has no dentist-we will refer him to dentist on-call provider with outpatient resources ? ? ? ?Lab Tests: ? ?I Ordered, and personally interpreted labs.  The pertinent results include: N/A ? ? ?Imaging Studies ordered: ? ?I ordered imaging studies including N/A ?I independently visualized and interpreted imaging which showed N/A ?I agree with the radiologist interpretation ? ? ?Cardiac Monitoring: ? ?The patient was maintained on a cardiac monitor.  I personally viewed and interpreted the cardiac monitored which showed an underlying rhythm of: N/A ? ? ?Medicines ordered and prescription drug management: ? ?I ordered medication including N/A ?I have reviewed the patients home medicines and have made adjustments as needed ? ?Critical Interventions: ? ?N/A ? ? ?Reevaluation: ? ?N/A ? ?Consultations Obtained: ? ?N/A ? ? ? ?Test Considered: ? ?CT maxillofacial will defer as my suspicion for actual abscess very low at this time and/or Ludwig angina. ? ? ? ?Rule out ?I have low suspicion for peritonsillar abscess, retropharyngeal abscess, or Ludwig angina as oropharynx was visualized tongue and uvula were both midline, there is no exudates, erythema or edema noted in the posterior pillars or on/ around tonsils.  Low suspicion for an abscess as gumline were palpated no fluctuance or induration felt.  Low suspicion for periorbital or orbital cellulitis as patient face had no erythematous, patient EOMs were intact, he had no pain with eye movement. ? ? ? ? ?Dispostion and problem list ? ?After consideration of the diagnostic results  and the patients response to treatment, I feel that the patent would benefit from discharge.  ? ? ?Dental pain-likely secondary due to dental cavity, started on antibiotics, over-the-counter pain medications, follow-up with a dentist for further evaluation and strict return precautions ? ? ? ? ? ? ? ? ? ? ?Final Clinical Impression(s) / ED Diagnoses ?Final diagnoses:  ?Pain, dental  ? ? ?Rx / DC Orders ?ED Discharge Orders   ? ?      Ordered  ?  penicillin v potassium (VEETID) 500 MG tablet  4 times daily       ? 12/10/21 1529  ? ?  ?  ? ?  ? ? ?  ?Marcello Fennel, PA-C ?12/10/21 1530 ? ?  ?Lorelle Gibbs, DO ?12/10/21 1547 ? ?

## 2021-12-10 NOTE — ED Triage Notes (Signed)
Complains of left wisdom tooth pain and infection for "a minute".  Patient reports just got insurance to get it taken out.  ?

## 2021-12-10 NOTE — ED Notes (Signed)
Pt with left lower dental pain for months, just got insurance.  Pt denies having a dentist currently.  Taking tylenol at home for the pain. ?

## 2021-12-10 NOTE — Discharge Instructions (Signed)
You have a dental cavity you are on antibiotics take as prescribed recommend over-the-counter pain medication as needed for pain. ? ?Please follow-up with a dentist given the contact information above please call ? ?Come back to the emergency department if you develop chest pain, shortness of breath, severe abdominal pain, uncontrolled nausea, vomiting, diarrhea. ? ?

## 2022-01-02 ENCOUNTER — Emergency Department (HOSPITAL_COMMUNITY)
Admission: EM | Admit: 2022-01-02 | Discharge: 2022-01-03 | Disposition: A | Discharge status: Home / Self Care | Payer: Self-pay | Attending: Emergency Medicine | Admitting: Emergency Medicine

## 2022-01-02 ENCOUNTER — Encounter (HOSPITAL_COMMUNITY): Payer: Self-pay | Admitting: Emergency Medicine

## 2022-01-02 DIAGNOSIS — R000 Tachycardia, unspecified: Secondary | ICD-10-CM | POA: Insufficient documentation

## 2022-01-02 DIAGNOSIS — R1084 Generalized abdominal pain: Secondary | ICD-10-CM | POA: Insufficient documentation

## 2022-01-02 DIAGNOSIS — Z20822 Contact with and (suspected) exposure to covid-19: Secondary | ICD-10-CM | POA: Insufficient documentation

## 2022-01-02 LAB — COMPREHENSIVE METABOLIC PANEL
ALT: 68 U/L — ABNORMAL HIGH (ref 0–44)
AST: 231 U/L — ABNORMAL HIGH (ref 15–41)
Albumin: 4.9 g/dL (ref 3.5–5.0)
Alkaline Phosphatase: 46 U/L (ref 38–126)
Anion gap: 13 (ref 5–15)
BUN: 23 mg/dL — ABNORMAL HIGH (ref 6–20)
CO2: 19 mmol/L — ABNORMAL LOW (ref 22–32)
Calcium: 9.8 mg/dL (ref 8.9–10.3)
Chloride: 102 mmol/L (ref 98–111)
Creatinine, Ser: 1.8 mg/dL — ABNORMAL HIGH (ref 0.61–1.24)
GFR, Estimated: 53 mL/min — ABNORMAL LOW (ref >60)
Glucose, Bld: 92 mg/dL (ref 70–99)
Potassium: 3.6 mmol/L (ref 3.5–5.1)
Sodium: 134 mmol/L — ABNORMAL LOW (ref 135–145)
Total Bilirubin: 0.9 mg/dL (ref 0.3–1.2)
Total Protein: 8.3 g/dL — ABNORMAL HIGH (ref 6.5–8.1)

## 2022-01-02 LAB — RESP PANEL BY RT-PCR (FLU A&B, COVID) ARPGX2
Influenza A by PCR: NEGATIVE
Influenza B by PCR: NEGATIVE
SARS Coronavirus 2 by RT PCR: NEGATIVE

## 2022-01-02 LAB — LIPASE, BLOOD: Lipase: 23 U/L (ref 11–51)

## 2022-01-02 LAB — CBC WITH DIFFERENTIAL/PLATELET
Abs Immature Granulocytes: 0.04 10*3/uL (ref 0.00–0.07)
Basophils Absolute: 0.1 10*3/uL (ref 0.0–0.1)
Basophils Relative: 1 %
Eosinophils Absolute: 0.2 10*3/uL (ref 0.0–0.5)
Eosinophils Relative: 2 %
HCT: 42.7 % (ref 39.0–52.0)
Hemoglobin: 14.4 g/dL (ref 13.0–17.0)
Immature Granulocytes: 0 %
Lymphocytes Relative: 15 %
Lymphs Abs: 1.4 10*3/uL (ref 0.7–4.0)
MCH: 31.7 pg (ref 26.0–34.0)
MCHC: 33.7 g/dL (ref 30.0–36.0)
MCV: 94.1 fL (ref 80.0–100.0)
Monocytes Absolute: 0.8 10*3/uL (ref 0.1–1.0)
Monocytes Relative: 9 %
Neutro Abs: 6.7 10*3/uL (ref 1.7–7.7)
Neutrophils Relative %: 73 %
Platelets: 266 10*3/uL (ref 150–400)
RBC: 4.54 MIL/uL (ref 4.22–5.81)
RDW: 13.8 % (ref 11.5–15.5)
WBC: 9.2 10*3/uL (ref 4.0–10.5)
nRBC: 0 % (ref 0.0–0.2)

## 2022-01-02 LAB — RAPID URINE DRUG SCREEN, HOSP PERFORMED
Amphetamines: POSITIVE — AB
Barbiturates: NOT DETECTED
Benzodiazepines: NOT DETECTED
Cocaine: POSITIVE — AB
Opiates: NOT DETECTED
Tetrahydrocannabinol: POSITIVE — AB

## 2022-01-02 NOTE — ED Provider Notes (Signed)
? ?Emergency Department Provider Note ? ? ?I have reviewed the triage vital signs and the nursing notes. ? ? ?HISTORY ? ?Chief Complaint ?Multiple Complaints ? ? ?HPI ?Micheal Bryant is a 26 y.o. male with no significant past medical history presents to the emergency department with body aches and abdominal discomfort.  Patient tells me that he had a dental infection treated with antibiotic which she completed 2 weeks ago.  He states since that time he has had body aches along with abdominal discomfort.  He feels like his teeth are "decaying" and generally feeling unwell.  Denies any drug or alcohol use.  Self reports feeling "anxious" but no SI/HI. No AH/VH. No prior history of the same.  Notes that he does have a very physically demanding job but overall does it without difficulty. No recent injury.  ? ? ?Past Medical History:  ?Diagnosis Date  ? Medical history non-contributory   ? ? ?Review of Systems ? ?Constitutional: No fever/chills. Positive generalized body aches.  ?Eyes: No visual changes. ?ENT: No sore throat. ?Cardiovascular: Denies chest pain. ?Respiratory: Denies shortness of breath. ?Gastrointestinal: Positive abdominal pain.  No nausea, no vomiting.  No diarrhea.  No constipation. ?Genitourinary: Negative for dysuria. ?Musculoskeletal: Negative for back pain. ?Skin: Negative for rash. ?Neurological: Negative for headaches, focal weakness or numbness. ? ?____________________________________________ ? ? ?PHYSICAL EXAM: ? ?VITAL SIGNS: ?ED Triage Vitals  ?Enc Vitals Group  ?   BP 01/02/22 2118 140/87  ?   Pulse Rate 01/02/22 2118 (!) 115  ?   Resp 01/02/22 2118 18  ?   Temp 01/02/22 2118 98 ?F (36.7 ?C)  ?   Temp Source 01/02/22 2118 Oral  ?   SpO2 01/02/22 2118 100 %  ?   Weight 01/02/22 2119 190 lb (86.2 kg)  ?   Height 01/02/22 2119 6\' 4"  (1.93 m)  ? ?Constitutional: Alert and oriented. Well appearing and in no acute distress. ?Eyes: Conjunctivae are normal.  ?Head: Atraumatic. ?Nose: No  congestion/rhinnorhea. ?Mouth/Throat: Mucous membranes are moist.  No trismus.  No visible abscess.  Overall poor dentition which appears chronic.  ?Neck: No stridor.  ?Cardiovascular: Mild tachycardia. Good peripheral circulation. Grossly normal heart sounds.   ?Respiratory: Normal respiratory effort.  No retractions. Lungs CTAB. ?Gastrointestinal: Soft and nontender. No distention.  ?Musculoskeletal: No lower extremity tenderness nor edema. No gross deformities of extremities. ?Neurologic:  Normal speech and language. No gross focal neurologic deficits are appreciated.  ?Skin:  Skin is warm, dry and intact. No rash noted. ? ?____________________________________________ ?  ?LABS ?(all labs ordered are listed, but only abnormal results are displayed) ? ?Labs Reviewed  ?COMPREHENSIVE METABOLIC PANEL - Abnormal; Notable for the following components:  ?    Result Value  ? Sodium 134 (*)   ? CO2 19 (*)   ? BUN 23 (*)   ? Creatinine, Ser 1.80 (*)   ? Total Protein 8.3 (*)   ? AST 231 (*)   ? ALT 68 (*)   ? GFR, Estimated 53 (*)   ? All other components within normal limits  ?RAPID URINE DRUG SCREEN, HOSP PERFORMED - Abnormal; Notable for the following components:  ? Cocaine POSITIVE (*)   ? Amphetamines POSITIVE (*)   ? Tetrahydrocannabinol POSITIVE (*)   ? All other components within normal limits  ?RESP PANEL BY RT-PCR (FLU A&B, COVID) ARPGX2  ?LIPASE, BLOOD  ?CBC WITH DIFFERENTIAL/PLATELET  ?ETHANOL  ? ? ?____________________________________________ ? ? ?PROCEDURES ? ?Procedure(s) performed:  ? ?Procedures ? ? ?  ____________________________________________ ? ? ?INITIAL IMPRESSION / ASSESSMENT AND PLAN / ED COURSE ? ?Pertinent labs & imaging results that were available during my care of the patient were reviewed by me and considered in my medical decision making (see chart for details). ?  ?This patient is Presenting for Evaluation of body aches, which does require a range of treatment options, and is a complaint that  involves a high risk of morbidity and mortality. ? ?The Differential Diagnoses include developing infection, sepsis/SIRS, viral syndrome, anxiety. ? ? ?I decided to review pertinent External Data, and in summary no recent ED visits for similar. ?  ?Clinical Laboratory Tests Ordered, included UDS positive for cocaine and amphetamine.  Suspect this may be related to his symptoms.  CMP/CBC/lipase pending.  ? ? ?Cardiac Monitor Tracing which shows NSR. ? ? ?Social Determinants of Health Risk denies ETOH or drugs. ? ?Medical Decision Making: Summary:  ?Patient presents emergency department for evaluation of body aches with abdominal discomfort.  Symptoms are fairly nonspecific.  Abdomen diffusely soft nontender.  Patient self describes feeling anxious.  No clear acute psychiatric decompensation.  Plan for screening blood work given abdominal discomfort and will reassess.  ? ?Care transferred to Dr. Oletta Cohn pending follow up labs and reassess.  ? ?Disposition: pending  ? ?____________________________________________ ? ?FINAL CLINICAL IMPRESSION(S) / ED DIAGNOSES ? ?Final diagnoses:  ?Generalized abdominal pain  ? ? ?Note:  This document was prepared using Dragon voice recognition software and may include unintentional dictation errors. ? ?Alona Bene, MD, FACEP ?Emergency Medicine ? ?  ?Maia Plan, MD ?01/03/22 2308 ? ?

## 2022-01-02 NOTE — ED Triage Notes (Signed)
Pt recently diagnosed with dental abscess. Pt now requesting a full check up due to not feeling well. Pt states that his "insides hurt".  ?

## 2022-01-03 ENCOUNTER — Other Ambulatory Visit: Payer: Self-pay

## 2022-01-03 ENCOUNTER — Emergency Department (HOSPITAL_COMMUNITY): Payer: Self-pay

## 2022-01-03 ENCOUNTER — Emergency Department (HOSPITAL_COMMUNITY)
Admission: EM | Admit: 2022-01-03 | Discharge: 2022-01-03 | Disposition: A | Discharge status: Home / Self Care | Payer: Self-pay | Attending: Emergency Medicine | Admitting: Emergency Medicine

## 2022-01-03 ENCOUNTER — Encounter (HOSPITAL_COMMUNITY): Payer: Self-pay

## 2022-01-03 DIAGNOSIS — R109 Unspecified abdominal pain: Secondary | ICD-10-CM | POA: Insufficient documentation

## 2022-01-03 DIAGNOSIS — F419 Anxiety disorder, unspecified: Secondary | ICD-10-CM | POA: Insufficient documentation

## 2022-01-03 DIAGNOSIS — Z91010 Allergy to peanuts: Secondary | ICD-10-CM | POA: Insufficient documentation

## 2022-01-03 DIAGNOSIS — R6884 Jaw pain: Secondary | ICD-10-CM | POA: Insufficient documentation

## 2022-01-03 LAB — ETHANOL: Alcohol, Ethyl (B): <10 mg/dL (ref <10)

## 2022-01-03 MED ORDER — ONDANSETRON 4 MG PO TBDP: 4.0000 mg | ORAL_TABLET | Freq: Three times a day (TID) | ORAL | 0 refills | Status: DC | PRN

## 2022-01-03 NOTE — Discharge Instructions (Signed)
Call your primary care doctor or specialist as discussed in the next 2-3 days.   Return immediately back to the ER if:  Your symptoms worsen within the next 12-24 hours. You develop new symptoms such as new fevers, persistent vomiting, new pain, shortness of breath, or new weakness or numbness, or if you have any other concerns.  

## 2022-01-03 NOTE — ED Provider Notes (Signed)
[facility] EMERGENCY DEPARTMENT Provider Note   CSN: [id number] Arrival date & time: 01/03/22  1356     History  Chief Complaint  Patient presents with   Abdominal Pain    Micheal Bryant is a 26 y.o. male.  Patient presents with chief complaint of generalized body aches and nausea.  He also is complaining that he was told his liver enzymes were high and he became more more anxious about it.  He states that he has some tenderness in the left side lower jaw as well.  Otherwise denies any new fevers or cough no vomiting or diarrhea.  Complains of some abdominal pain as intermittent, currently denies any abdominal pain.      Home Medications Prior to Admission medications   Medication Sig Start Date End Date Taking? Authorizing Provider  ondansetron (ZOFRAN-ODT) 4 MG disintegrating tablet Take 1 tablet (4 mg total) by mouth every 8 (eight) hours as needed for up to 10 doses for nausea or vomiting. 01/03/22  Yes China, Eustace Moore, MD  acetaminophen (TYLENOL) 500 MG tablet Take 2 tablets (1,000 mg total) by mouth every 6 (six) hours. Patient taking differently: Take 1,000 mg by mouth every 6 (six) hours as needed for moderate pain. 02/15/20   Haddix, Gillie Manners, MD  clindamycin (CLEOCIN) 300 MG capsule Take 1 capsule (300 mg total) by mouth 3 (three) times daily. 01/05/22   Triplett, Tammy, PA-C  ibuprofen (ADVIL,MOTRIN) 200 MG tablet Take 200 mg by mouth every 6 (six) hours as needed. Pain  Patient not taking: Reported on 12/10/2021    [provider]      Allergies    Fish allergy and Peanut-containing drug products    Review of Systems   Review of Systems  Constitutional:  Negative for fever.  HENT:  Negative for ear pain and sore throat.   Eyes:  Negative for pain.  Respiratory:  Negative for cough.   Cardiovascular:  Negative for chest pain.  Gastrointestinal:  Positive for abdominal pain.  Genitourinary:  Negative for flank pain.  Musculoskeletal:  Negative for  back pain.  Skin:  Negative for color change and rash.  Neurological:  Negative for syncope.  All other systems reviewed and are negative.  Physical Exam Updated Vital Signs BP 108/74   Pulse (!) 106   Temp 98.1 F (36.7 C) (Oral)   Resp 18   Wt 86.2 kg   SpO2 99%   BMI 23.13 kg/m  Physical Exam Constitutional:      Appearance: He is well-developed.  HENT:     Head: Normocephalic.     Nose: Nose normal.  Eyes:     Extraocular Movements: Extraocular movements intact.  Cardiovascular:     Rate and Rhythm: Normal rate.  Pulmonary:     Effort: Pulmonary effort is normal.  Abdominal:     Tenderness: There is no abdominal tenderness. There is no guarding or rebound.  Skin:    Coloration: Skin is not jaundiced.  Neurological:     Mental Status: He is alert. Mental status is at baseline.    ED Results / Procedures / Treatments   Labs (all labs ordered are listed, but only abnormal results are displayed) Labs Reviewed - No data to display  EKG None  Radiology No results found.  Procedures Procedures    Medications Ordered in ED Medications - No data to display  ED Course/ Medical Decision Making/ A&P  Medical Decision Making Risk Prescription drug management.   Chart review shows prior ER visit yesterday.  Review of records shows prior labs recently, creatinine mildly elevated, liver enzymes mildly elevated.  Given these findings initially was concern for possible cholecystitis or cholelithiasis.  However the patient has no tenderness on exam no guarding no rebound.  He complains of intermittent nausea intermittent abdominal cramping tooth ache and body aches.  No specific pain at this time.  Recommending close outpatient follow-up with his primary care doctor this week.  We will give prescription of Zofran advise increase fluid intake at home.  Advised return if his pain becomes worse, if he has fevers, vomiting or any additional  concerns.        Final Clinical Impression(s) / ED Diagnoses Final diagnoses:  Abdominal pain, unspecified abdominal location    Rx / DC Orders ED Discharge Orders          Ordered    ondansetron (ZOFRAN-ODT) 4 MG disintegrating tablet  Every 8 hours PRN        01/03/22 1634              Cheryll Cockayne, MD 01/12/22 2219

## 2022-01-03 NOTE — ED Triage Notes (Signed)
Pt reports that he has abd pain that started last night.  Reports was seen here yesterday and was told his liver enzymes were high.  Pt has multiple other complaints to include dental pain, anxiety, and self dx sepsis. Marland Kitchen  Resp even and unlabored.  Skin warm and dry.   ?

## 2022-01-04 ENCOUNTER — Other Ambulatory Visit: Payer: Self-pay

## 2022-01-04 ENCOUNTER — Emergency Department (HOSPITAL_COMMUNITY)
Admission: EM | Admit: 2022-01-04 | Discharge: 2022-01-04 | Disposition: A | Discharge status: Home / Self Care | Payer: Self-pay | Attending: Emergency Medicine | Admitting: Emergency Medicine

## 2022-01-04 ENCOUNTER — Encounter (HOSPITAL_COMMUNITY): Payer: Self-pay

## 2022-01-04 DIAGNOSIS — Z5321 Procedure and treatment not carried out due to patient leaving prior to being seen by health care provider: Secondary | ICD-10-CM | POA: Insufficient documentation

## 2022-01-04 DIAGNOSIS — K0889 Other specified disorders of teeth and supporting structures: Secondary | ICD-10-CM | POA: Insufficient documentation

## 2022-01-04 NOTE — ED Triage Notes (Signed)
Right lower dental pain . Supposed to go to dentist tomorrow to get pulled.  ?

## 2022-01-05 ENCOUNTER — Encounter (HOSPITAL_COMMUNITY): Payer: Self-pay | Admitting: Emergency Medicine

## 2022-01-05 ENCOUNTER — Emergency Department (HOSPITAL_COMMUNITY)
Admission: EM | Admit: 2022-01-05 | Discharge: 2022-01-05 | Disposition: A | Discharge status: Home / Self Care | Payer: Self-pay | Attending: Emergency Medicine | Admitting: Emergency Medicine

## 2022-01-05 DIAGNOSIS — K0889 Other specified disorders of teeth and supporting structures: Secondary | ICD-10-CM

## 2022-01-05 DIAGNOSIS — K029 Dental caries, unspecified: Secondary | ICD-10-CM | POA: Insufficient documentation

## 2022-01-05 DIAGNOSIS — Z91010 Allergy to peanuts: Secondary | ICD-10-CM | POA: Insufficient documentation

## 2022-01-05 HISTORY — DX: Other psychoactive substance abuse, uncomplicated: F19.10

## 2022-01-05 MED ORDER — CLINDAMYCIN HCL 300 MG PO CAPS: 300.0000 mg | ORAL_CAPSULE | Freq: Three times a day (TID) | ORAL | 0 refills | Status: DC

## 2022-01-05 MED ORDER — CLINDAMYCIN HCL 150 MG PO CAPS
300.0000 mg | ORAL_CAPSULE | Freq: Once | ORAL | Status: AC
  Administered 2022-01-05: 300 mg via ORAL
  Filled 2022-01-05: qty 2

## 2022-01-05 NOTE — ED Triage Notes (Signed)
Pt arrives again c/o dental pain. Pt states he is going to dentist today to have tooth pulled but he is worried that h has infection in his blood from his tooth.  ?

## 2022-01-05 NOTE — Discharge Instructions (Signed)
Take the antibiotic as directed until its finished.  You may use over-the-counter Orajel as directed if needed for tooth discomfort.  Call one of the dentist on the list provided to arrange a follow-up appointment. ?

## 2022-01-07 NOTE — ED Provider Notes (Signed)
?[facility] ?Provider Note ? ? ?CSN: [id number] ?Arrival date & time: 01/05/22  0518 ? ?  ? ?History ? ?Chief Complaint  ?Patient presents with  ? Dental Pain  ? ? ?[name] is a 26 y.o. male. ? ? ?Dental Pain ?Associated symptoms: no congestion, no facial swelling, no fever and no headaches   ? ?  ? ? ?Micheal Bryant is a 26 y.o. male who presents to the Emergency Department complaining of left lower dental pain.  Reports tooth pain has been going on for some time.  He is concerned that he may be "septic."  He is concerned that infection from his tooth may be in his bloodstream.  Patient denies any nausea, vomiting, facial swelling, difficulty swallowing or breathing, fever or chills. ? ? ?Home Medications ?Prior to Admission medications   ?Medication Sig Start Date End Date Taking? Authorizing Provider  ?clindamycin (CLEOCIN) 300 MG capsule Take 1 capsule (300 mg total) by mouth 3 (three) times daily. 01/05/22  Yes Vennessa Affinito, PA-C  ?acetaminophen (TYLENOL) 500 MG tablet Take 2 tablets (1,000 mg total) by mouth every 6 (six) hours. ?Patient taking differently: Take 1,000 mg by mouth every 6 (six) hours as needed for moderate pain. 02/15/20   Haddix, Gillie Manners, MD  ?ibuprofen (ADVIL,MOTRIN) 200 MG tablet Take 200 mg by mouth every 6 (six) hours as needed. Pain  ?Patient not taking: Reported on 12/10/2021    [provider]  ?ondansetron (ZOFRAN-ODT) 4 MG disintegrating tablet Take 1 tablet (4 mg total) by mouth every 8 (eight) hours as needed for up to 10 doses for nausea or vomiting. 01/03/22   Cheryll Cockayne, MD  ?   ? ?Allergies    ?Fish allergy and Peanut-containing drug products   ? ?Review of Systems   ?Review of Systems  ?Constitutional:  Negative for appetite change, chills and fever.  ?HENT:  Positive for dental problem. Negative for congestion, ear pain, facial swelling and trouble swallowing.   ?Respiratory:  Negative for shortness of breath.    ?Cardiovascular:  Negative for chest pain.  ?Gastrointestinal:  Negative for abdominal pain, nausea and vomiting.  ?Neurological:  Negative for dizziness, weakness and headaches.  ? ?Physical Exam ?Updated Vital Signs ?BP 133/88 (BP Location: Right Arm)   Pulse 71   Temp 97.6 ?F (36.4 ?C) (Oral)   Resp 18   Ht 6\' 4"  (1.93 m)   Wt 86.2 kg   SpO2 100%   BMI 23.13 kg/m?  ?Physical Exam ?Vitals and nursing note reviewed.  ?Constitutional:   ?   General: He is not in acute distress. ?   Appearance: Normal appearance. He is not ill-appearing or toxic-appearing.  ?HENT:  ?   Right Ear: Tympanic membrane and ear canal normal.  ?   Left Ear: Tympanic membrane and ear canal normal.  ?   Nose: Nose normal.  ?   Mouth/Throat:  ?   Mouth: Mucous membranes are moist.  ?   Comments: Several dental caries noted.  There is tenderness to palpation of the left lower second and third molar.  Third molar appears slightly impacted.  There is no fluctuance or significant edema of the surrounding gingiva.  Uvula midline nonedematous.  No sublingual abnormalities or trismus. ?Cardiovascular:  ?   Rate and Rhythm: Normal rate and regular rhythm.  ?   Pulses: Normal pulses.  ?Pulmonary:  ?   Effort: Pulmonary effort is normal.  ?Musculoskeletal:     ?  General: Normal range of motion.  ?   Cervical back: Normal range of motion.  ?Lymphadenopathy:  ?   Cervical: No cervical adenopathy.  ?Skin: ?   General: Skin is warm.  ?Neurological:  ?   General: No focal deficit present.  ?   Mental Status: He is alert.  ?   Sensory: No sensory deficit.  ?   Motor: No weakness.  ? ? ?ED Results / Procedures / Treatments   ?Labs ?(all labs ordered are listed, but only abnormal results are displayed) ?Labs Reviewed - No data to display ? ?EKG ?None ? ?Radiology ?No results found. ? ?Procedures ?Procedures  ? ? ?Medications Ordered in ED ?Medications  ?clindamycin (CLEOCIN) capsule 300 mg (300 mg Oral Given 01/05/22 0942)  ? ? ?ED Course/ Medical  Decision Making/ A&P ?  ?                        ?Medical Decision Making ?Risk ?Prescription drug management. ? ? ?Patient here for evaluation of dental pain.  Has upcoming a dental appointment.  Concerned that he had an abscess of his tooth and that infection was getting into his bloodstream causing him to be "septic." ? ?On exam, patient is slightly anxious appearing.  He has reassuring vital signs.  He has no fever, vomiting, facial swelling.  Clinically, there are no concerning symptoms to suggest a septic process, there is no drainable dental abscess and no clinical symptoms suggestive of Ludewig's angina.  Patient has been reassured.  He does have some dental caries and a mildly impacted molar.  Will provide prescription for antibiotics.  He is agreeable to keep his upcoming dental appointment.  Patient appears appropriate for discharge home.  All questions were answered. ? ? ? ? ? ? ? ?Final Clinical Impression(s) / ED Diagnoses ?Final diagnoses:  ?Pain, dental  ? ? ?Rx / DC Orders ?ED Discharge Orders   ? ?      Ordered  ?  clindamycin (CLEOCIN) 300 MG capsule  3 times daily       ? 01/05/22 0939  ? ?  ?  ? ?  ? ? ?  ?Pauline Aus, PA-C ?01/07/22 1339 ? ?  ?Vanetta Mulders, MD ?01/10/22 1613 ? ?

## 2022-01-08 ENCOUNTER — Encounter (INDEPENDENT_AMBULATORY_CARE_PROVIDER_SITE_OTHER): Payer: Self-pay | Admitting: *Deleted

## 2022-01-11 ENCOUNTER — Ambulatory Visit (HOSPITAL_COMMUNITY): Payer: Self-pay

## 2022-02-12 ENCOUNTER — Ambulatory Visit (INDEPENDENT_AMBULATORY_CARE_PROVIDER_SITE_OTHER): Payer: Self-pay | Admitting: Gastroenterology

## 2022-04-06 ENCOUNTER — Encounter: Payer: Self-pay | Admitting: Nurse Practitioner

## 2022-04-06 ENCOUNTER — Ambulatory Visit (INDEPENDENT_AMBULATORY_CARE_PROVIDER_SITE_OTHER): Payer: Self-pay | Admitting: Nurse Practitioner

## 2022-04-06 VITALS — BP 126/72 | HR 72 | Ht 77.0 in | Wt 205.0 lb

## 2022-04-06 DIAGNOSIS — G8929 Other chronic pain: Secondary | ICD-10-CM

## 2022-04-06 DIAGNOSIS — R748 Abnormal levels of other serum enzymes: Secondary | ICD-10-CM

## 2022-04-06 DIAGNOSIS — M545 Low back pain, unspecified: Secondary | ICD-10-CM | POA: Insufficient documentation

## 2022-04-06 DIAGNOSIS — Z139 Encounter for screening, unspecified: Secondary | ICD-10-CM

## 2022-04-06 DIAGNOSIS — M19049 Primary osteoarthritis, unspecified hand: Secondary | ICD-10-CM

## 2022-04-06 DIAGNOSIS — N1831 Chronic kidney disease, stage 3a: Secondary | ICD-10-CM | POA: Insufficient documentation

## 2022-04-06 DIAGNOSIS — F411 Generalized anxiety disorder: Secondary | ICD-10-CM

## 2022-04-06 DIAGNOSIS — R0789 Other chest pain: Secondary | ICD-10-CM

## 2022-04-06 MED ORDER — DICLOFENAC SODIUM 1 % EX GEL
2.0000 g | Freq: Four times a day (QID) | CUTANEOUS | 0 refills | Status: DC
Start: 2022-04-06 — End: 2022-10-26

## 2022-04-06 NOTE — Assessment & Plan Note (Signed)
EKG shows SR Rate of 69 I have no concern for ischemia Chest tightness mostly like due to anxiety or musculoskeletal dysfunction.  Does a lot of lifting at work

## 2022-04-06 NOTE — Assessment & Plan Note (Signed)
GAD7 score 16 Denies SI,HI Willing to speak with a Counsellor Will revaluate need for medication at next visit.

## 2022-04-06 NOTE — Assessment & Plan Note (Signed)
Denies use of alcohol  Pt encouraged to avoid alcohol , tylenol Check CMP , Hep C labs

## 2022-04-06 NOTE — Patient Instructions (Addendum)
Apply voltaren gel 2g every 6 hours as needed for hand pain  Apply Voltaren gel 4mg  every 6 hours as needed for back pain    Please avoid aleve, ibuprofen , tylenol, alcohol  It is important that you exercise regularly at least 30 minutes 5 times a week.  Think about what you will eat, plan ahead. Choose " clean, green, fresh or frozen" over canned, processed or packaged foods which are more sugary, salty and fatty. 70 to 75% of food eaten should be vegetables and fruit. Three meals at set times with snacks allowed between meals, but they must be fruit or vegetables. Aim to eat over a 12 hour period , example 7 am to 7 pm, and STOP after  your last meal of the day. Drink water,generally about 64 ounces per day, no other drink is as healthy. Fruit juice is best enjoyed in a healthy way, by EATING the fruit.  Thanks for choosing [facility], we consider it a privelige to serve you.

## 2022-04-06 NOTE — Assessment & Plan Note (Signed)
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
## 2022-04-06 NOTE — Assessment & Plan Note (Signed)
Does a lot of heavy lifting at work Hand exercises encouraged Apply voltaren gel 2g every 6 hours as needed

## 2022-05-17 ENCOUNTER — Encounter: Payer: Self-pay | Admitting: Nurse Practitioner
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
## 2022-07-24 ENCOUNTER — Ambulatory Visit: Payer: Self-pay | Admitting: Internal Medicine

## 2022-08-16 ENCOUNTER — Ambulatory Visit: Payer: Self-pay | Admitting: Internal Medicine

## 2022-08-31 ENCOUNTER — Ambulatory Visit (INDEPENDENT_AMBULATORY_CARE_PROVIDER_SITE_OTHER): Payer: Self-pay | Admitting: Internal Medicine

## 2022-08-31 ENCOUNTER — Encounter: Payer: Self-pay | Admitting: Internal Medicine

## 2022-08-31 VITALS — BP 101/65 | HR 76 | Ht 77.0 in | Wt 201.2 lb

## 2022-08-31 DIAGNOSIS — K047 Periapical abscess without sinus: Secondary | ICD-10-CM

## 2022-08-31 DIAGNOSIS — R634 Abnormal weight loss: Secondary | ICD-10-CM

## 2022-08-31 DIAGNOSIS — F411 Generalized anxiety disorder: Secondary | ICD-10-CM

## 2022-08-31 DIAGNOSIS — F419 Anxiety disorder, unspecified: Secondary | ICD-10-CM

## 2022-08-31 DIAGNOSIS — B356 Tinea cruris: Secondary | ICD-10-CM

## 2022-08-31 DIAGNOSIS — F32A Depression, unspecified: Secondary | ICD-10-CM

## 2022-08-31 DIAGNOSIS — Z1159 Encounter for screening for other viral diseases: Secondary | ICD-10-CM

## 2022-08-31 DIAGNOSIS — Z0001 Encounter for general adult medical examination with abnormal findings: Secondary | ICD-10-CM

## 2022-08-31 MED ORDER — CLOTRIMAZOLE 1 % EX LOTN: 1.0000 | TOPICAL_LOTION | Freq: Every day | CUTANEOUS | 0 refills | Status: DC

## 2022-08-31 MED ORDER — CLOTRIMAZOLE 1 % EX CREA: 1.0000 | TOPICAL_CREAM | Freq: Two times a day (BID) | CUTANEOUS | 0 refills | Status: DC

## 2022-08-31 MED ORDER — AMOXICILLIN-POT CLAVULANATE 875-125 MG PO TABS: 1.0000 | ORAL_TABLET | Freq: Two times a day (BID) | ORAL | 0 refills | Status: AC

## 2022-08-31 MED ORDER — ESCITALOPRAM OXALATE 10 MG PO TABS: 10.0000 mg | ORAL_TABLET | Freq: Every day | ORAL | 2 refills | Status: DC
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
## 2022-08-31 NOTE — Patient Instructions (Signed)
It was a pleasure to see you today.  Thank you for giving [place] the opportunity to be involved in your care.  Below is a brief recap of your visit and next steps.  We will plan to see you again in 4 weeks.  Summary Start Augmentin twice daily x 7 days for dental infection, I recommend doing twice daily salt water rinses as well Start clotrimazole lotion for fungal infection in groin We will baseline labs today Start lexapro for depression and anxiety Follow up in 4 weeks

## 2022-09-01 LAB — CBC WITH DIFFERENTIAL/PLATELET
Basophils Absolute: 0.1 10*3/uL (ref 0.0–0.2)
Basos: 1 %
EOS (ABSOLUTE): 0.2 10*3/uL (ref 0.0–0.4)
Eos: 3 %
Hematocrit: 36.3 % — ABNORMAL LOW (ref 37.5–51.0)
Hemoglobin: 13.1 g/dL (ref 13.0–17.7)
Immature Grans (Abs): 0 10*3/uL (ref 0.0–0.1)
Immature Granulocytes: 0 %
Lymphocytes Absolute: 2.2 10*3/uL (ref 0.7–3.1)
Lymphs: 38 %
MCH: 30.8 pg (ref 26.6–33.0)
MCHC: 36.1 g/dL — ABNORMAL HIGH (ref 31.5–35.7)
MCV: 85 fL (ref 79–97)
Monocytes Absolute: 0.4 10*3/uL (ref 0.1–0.9)
Monocytes: 7 %
Neutrophils Absolute: 2.9 10*3/uL (ref 1.4–7.0)
Neutrophils: 51 %
Platelets: 249 10*3/uL (ref 150–450)
RBC: 4.26 x10E6/uL (ref 4.14–5.80)
RDW: 12 % (ref 11.6–15.4)
WBC: 5.8 10*3/uL (ref 3.4–10.8)

## 2022-09-01 LAB — CMP14+EGFR
ALT: 22 IU/L (ref 0–44)
AST: 30 IU/L (ref 0–40)
Albumin/Globulin Ratio: 1.6 (ref 1.2–2.2)
Albumin: 4.4 g/dL (ref 4.3–5.2)
Alkaline Phosphatase: 49 IU/L (ref 44–121)
BUN/Creatinine Ratio: 18 (ref 9–20)
BUN: 18 mg/dL (ref 6–20)
Bilirubin Total: 0.3 mg/dL (ref 0.0–1.2)
CO2: 23 mmol/L (ref 20–29)
Calcium: 9.1 mg/dL (ref 8.7–10.2)
Chloride: 105 mmol/L (ref 96–106)
Creatinine, Ser: 0.99 mg/dL (ref 0.76–1.27)
Globulin, Total: 2.8 g/dL (ref 1.5–4.5)
Glucose: 81 mg/dL (ref 70–99)
Potassium: 4.2 mmol/L (ref 3.5–5.2)
Sodium: 139 mmol/L (ref 134–144)
Total Protein: 7.2 g/dL (ref 6.0–8.5)
eGFR: 107 mL/min/{1.73_m2} (ref >59)

## 2022-09-01 LAB — LIPID PANEL
Chol/HDL Ratio: 2.5 ratio (ref 0.0–5.0)
Cholesterol, Total: 121 mg/dL (ref 100–199)
HDL: 49 mg/dL (ref >39)
LDL Chol Calc (NIH): 62 mg/dL (ref 0–99)
Triglycerides: 39 mg/dL (ref 0–149)
VLDL Cholesterol Cal: 10 mg/dL (ref 5–40)

## 2022-09-01 LAB — HEMOGLOBIN A1C
Est. average glucose Bld gHb Est-mCnc: 108 mg/dL
Hgb A1c MFr Bld: 5.4 % (ref 4.8–5.6)

## 2022-09-01 LAB — B12 AND FOLATE PANEL
Folate: 12.8 ng/mL (ref >3.0)
Vitamin B-12: 697 pg/mL (ref 232–1245)

## 2022-09-01 LAB — HCV INTERPRETATION

## 2022-09-01 LAB — CK: Total CK: 1113 U/L (ref 49–439)

## 2022-09-01 LAB — TSH+FREE T4
Free T4: 1.28 ng/dL (ref 0.82–1.77)
TSH: 0.594 u[IU]/mL (ref 0.450–4.500)

## 2022-09-01 LAB — HCV AB W REFLEX TO QUANT PCR: HCV Ab: NONREACTIVE

## 2022-09-01 LAB — VITAMIN D 25 HYDROXY (VIT D DEFICIENCY, FRACTURES): Vit D, 25-Hydroxy: 5.7 ng/mL — ABNORMAL LOW (ref 30.0–100.0)

## 2022-09-04 ENCOUNTER — Other Ambulatory Visit: Payer: Self-pay | Admitting: Internal Medicine

## 2022-09-04 DIAGNOSIS — R748 Abnormal levels of other serum enzymes: Secondary | ICD-10-CM

## 2022-09-04 DIAGNOSIS — E559 Vitamin D deficiency, unspecified: Secondary | ICD-10-CM

## 2022-09-04 MED ORDER — VITAMIN D (ERGOCALCIFEROL) 1.25 MG (50000 UNIT) PO CAPS: 50000.0000 [IU] | ORAL_CAPSULE | ORAL | 0 refills | Status: AC

## 2022-09-05 ENCOUNTER — Encounter: Payer: Self-pay | Admitting: Internal Medicine

## 2022-09-05 DIAGNOSIS — B356 Tinea cruris: Secondary | ICD-10-CM | POA: Insufficient documentation

## 2022-09-05 DIAGNOSIS — R634 Abnormal weight loss: Secondary | ICD-10-CM | POA: Insufficient documentation

## 2022-09-05 DIAGNOSIS — K047 Periapical abscess without sinus: Secondary | ICD-10-CM | POA: Insufficient documentation

## 2022-09-05 NOTE — Assessment & Plan Note (Signed)
He reports today that he has had a dental abscess for multiple months but needs to be treated.  On exam there is an pocket with purulent material visible along the left lower gumline. -Augmentin has been prescribed x 7 days -Have strongly encouraged him to establish care with a dentist for further treatment

## 2022-09-05 NOTE — Assessment & Plan Note (Signed)
One of his acute concerns today is that he has developed a rash in his groin and thighs bilaterally.  On exam today the rash appears most consistent with tinea cruris. -Clotrimazole 1% cream has been prescribed. -Follow-up in 4 weeks

## 2022-09-05 NOTE — Assessment & Plan Note (Signed)
He expresses concern today for unintentional weight loss and loss of muscle mass.  His weight today is 201 lbs and was 205 lbs at the time of his last appointment in August.  He is concerned that he does not look like the same person because of a loss of adipose tissue in his face.  He is highly concerned that there is an underlying process causing this.  He endorses fatigue but denies fever/chills, night sweats, and lymphadenopathy. -Baseline labs have been ordered today, including CK due to concern for loss of muscle mass -Further workup pending initial lab results

## 2022-09-05 NOTE — Assessment & Plan Note (Addendum)
Previously documented history of generalized anxiety disorder.  PHQ-9 and GAD-7 scores are elevated today.  He is interested in starting a medication for anxiety relief. -Start Lexapro 10 mg daily -Follow-up in 4 weeks for reassessment

## 2022-09-07 ENCOUNTER — Other Ambulatory Visit: Payer: Self-pay | Admitting: Internal Medicine

## 2022-09-07 DIAGNOSIS — E559 Vitamin D deficiency, unspecified: Secondary | ICD-10-CM

## 2022-09-25 LAB — CMP14+EGFR
ALT: 16 IU/L (ref 0–44)
AST: 19 IU/L (ref 0–40)
Albumin/Globulin Ratio: 1.7 (ref 1.2–2.2)
Albumin: 4.5 g/dL (ref 4.3–5.2)
Alkaline Phosphatase: 46 IU/L (ref 44–121)
BUN/Creatinine Ratio: 12 (ref 9–20)
BUN: 13 mg/dL (ref 6–20)
Bilirubin Total: 0.3 mg/dL (ref 0.0–1.2)
CO2: 22 mmol/L (ref 20–29)
Calcium: 9.3 mg/dL (ref 8.7–10.2)
Chloride: 107 mmol/L — ABNORMAL HIGH (ref 96–106)
Creatinine, Ser: 1.09 mg/dL (ref 0.76–1.27)
Globulin, Total: 2.6 g/dL (ref 1.5–4.5)
Glucose: 80 mg/dL (ref 70–99)
Potassium: 4.1 mmol/L (ref 3.5–5.2)
Sodium: 142 mmol/L (ref 134–144)
Total Protein: 7.1 g/dL (ref 6.0–8.5)
eGFR: 95 mL/min/{1.73_m2} (ref >59)

## 2022-09-25 LAB — SEDIMENTATION RATE: Sed Rate: 3 mm/hr (ref 0–15)

## 2022-09-25 LAB — ALDOLASE: Aldolase: 8.7 U/L (ref 3.3–10.3)

## 2022-09-25 LAB — C-REACTIVE PROTEIN: CRP: <1 mg/L (ref 0–10)

## 2022-09-25 LAB — CK: Total CK: 542 U/L (ref 49–439)

## 2022-09-28 ENCOUNTER — Ambulatory Visit (INDEPENDENT_AMBULATORY_CARE_PROVIDER_SITE_OTHER): Payer: Self-pay | Admitting: Internal Medicine

## 2022-09-28 ENCOUNTER — Encounter: Payer: Self-pay | Admitting: Internal Medicine

## 2022-09-28 VITALS — BP 120/71 | HR 78 | Ht 77.0 in | Wt 203.0 lb

## 2022-09-28 DIAGNOSIS — R748 Abnormal levels of other serum enzymes: Secondary | ICD-10-CM

## 2022-09-28 DIAGNOSIS — E559 Vitamin D deficiency, unspecified: Secondary | ICD-10-CM

## 2022-09-28 DIAGNOSIS — R37 Sexual dysfunction, unspecified: Secondary | ICD-10-CM

## 2022-09-28 DIAGNOSIS — F411 Generalized anxiety disorder: Secondary | ICD-10-CM

## 2022-09-28 DIAGNOSIS — R4189 Other symptoms and signs involving cognitive functions and awareness: Secondary | ICD-10-CM

## 2022-09-28 MED ORDER — ESCITALOPRAM OXALATE 20 MG PO TABS: 20.0000 mg | ORAL_TABLET | Freq: Every day | ORAL | 2 refills | Status: DC

## 2022-09-28 NOTE — Patient Instructions (Signed)
It was a pleasure to see you today.  Thank you for giving [place] the opportunity to be involved in your care.  Below is a brief recap of your visit and next steps.  We will plan to see you again in 4 weeks.  Summary Increase Lexapro to 20 mg daily Additional labs ordered today. Please come in at 8 am to have these completed Follow up in 4 weeks
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
## 2022-09-29 LAB — CK: Total CK: 556 U/L (ref 49–439)

## 2022-10-02 DIAGNOSIS — E559 Vitamin D deficiency, unspecified: Secondary | ICD-10-CM | POA: Insufficient documentation

## 2022-10-02 NOTE — Assessment & Plan Note (Signed)
Lexapro 10 mg daily was started at his last appointment for treatment of generalized anxiety disorder.  He states that this has been effective, however he believes there is room for further improvement. -Increase Lexapro to 20 mg daily

## 2022-10-02 NOTE — Assessment & Plan Note (Signed)
Noted on labs from 1/5.  He is currently on high-dose, weekly vitamin D supplementation. -Repeat vitamin D level upon completion of high-dose supplementation

## 2022-10-03 DIAGNOSIS — R4189 Other symptoms and signs involving cognitive functions and awareness: Secondary | ICD-10-CM | POA: Insufficient documentation

## 2022-10-03 DIAGNOSIS — R748 Abnormal levels of other serum enzymes: Secondary | ICD-10-CM | POA: Insufficient documentation

## 2022-10-03 NOTE — Assessment & Plan Note (Signed)
Etiology remains unclear.  No specific metabolic etiology has been identified. -Checking  AM cortisol and testosterone levels for further workup

## 2022-10-03 NOTE — Assessment & Plan Note (Signed)
Unclear etiology.  CK level initially elevated at 1100 on labs from early January.  Reduced by 50% on repeat labs from 1/26.  Inflammatory markers and aldolase level were within normal limits. -Repeat CK ordered today.  Consider further workup if CK level remains elevated

## 2022-10-17 LAB — TESTOSTERONE,FREE AND TOTAL
Testosterone, Free: 6.1 pg/mL — ABNORMAL LOW (ref 9.3–26.5)
Testosterone: 537 ng/dL (ref 264–916)

## 2022-10-17 LAB — CORTISOL: Cortisol: 11.3 ug/dL (ref 6.2–19.4)

## 2022-10-26 ENCOUNTER — Encounter: Payer: Self-pay | Admitting: Internal Medicine

## 2022-10-26 ENCOUNTER — Ambulatory Visit (INDEPENDENT_AMBULATORY_CARE_PROVIDER_SITE_OTHER): Payer: Self-pay | Admitting: Internal Medicine

## 2022-10-26 VITALS — BP 120/74 | HR 83 | Ht 77.0 in | Wt 207.2 lb

## 2022-10-26 DIAGNOSIS — F411 Generalized anxiety disorder: Secondary | ICD-10-CM

## 2022-10-26 DIAGNOSIS — R748 Abnormal levels of other serum enzymes: Secondary | ICD-10-CM

## 2022-10-26 DIAGNOSIS — K047 Periapical abscess without sinus: Secondary | ICD-10-CM

## 2022-10-26 MED ORDER — ESCITALOPRAM OXALATE 20 MG PO TABS: 20.0000 mg | ORAL_TABLET | Freq: Every day | ORAL | 2 refills | Status: DC

## 2022-10-26 MED ORDER — AMOXICILLIN-POT CLAVULANATE 875-125 MG PO TABS: 1.0000 | ORAL_TABLET | Freq: Two times a day (BID) | ORAL | 0 refills | Status: DC
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
## 2022-10-26 NOTE — Patient Instructions (Signed)
It was a pleasure to see you today.  Thank you for giving [place] the opportunity to be involved in your care.  Below is a brief recap of your visit and next steps.  We will plan to see you again in 3 months.  Summary Augmentin prescribed for dental abscess.  Lexapro refilled Please see the contact information for the free clinic of [facility].

## 2022-10-26 NOTE — Assessment & Plan Note (Signed)
This is the likely underlying cause for many of his symptoms.  He has had a dental abscess on the left lower gumline for multiple months.  Previously treated with Augmentin x 7 days which seem to temporarily alleviate his symptoms.  He endorses worsening pain today and states that is difficult for him to eat solid foods and open his mouth. -I have prescribed Augmentin for an additional 7 days.  We have also provided him with contact information for the free clinic of [facility] to see what dental resources are available.

## 2022-10-26 NOTE — Assessment & Plan Note (Signed)
Micheal Bryant checked in early January and was elevated at 1100.  Improved to 556 and 542 when repeated 1 month ago.  Additional labs have been normal.  I suspect the mild elevation is related to active infection in the setting of a dental abscess.  He was treated with Augmentin between his initial assessment and repeat evaluation.  His labs do not suggest an additional etiology currently. -Addressing dental abscess as above -We will tentatively plan for follow-up in 3 months for repeat labs.  Consider further workup if Micheal Bryant remains elevated following treatment of dental abscess.

## 2023-01-28 ENCOUNTER — Ambulatory Visit: Payer: Self-pay | Admitting: Internal Medicine

## 2023-06-11 ENCOUNTER — Ambulatory Visit: Payer: Managed Care, Other (non HMO) | Admitting: Internal Medicine

## 2023-06-13 ENCOUNTER — Encounter: Payer: Self-pay | Admitting: Internal Medicine

## 2023-09-06 ENCOUNTER — Ambulatory Visit: Payer: Self-pay | Admitting: Internal Medicine

## 2023-09-12 ENCOUNTER — Ambulatory Visit: Payer: Managed Care, Other (non HMO) | Admitting: Internal Medicine

## 2023-09-18 ENCOUNTER — Encounter: Payer: Self-pay | Admitting: Internal Medicine

## 2023-12-01 ENCOUNTER — Emergency Department (HOSPITAL_COMMUNITY): Payer: Self-pay

## 2023-12-01 ENCOUNTER — Other Ambulatory Visit: Payer: Self-pay

## 2023-12-01 ENCOUNTER — Encounter (HOSPITAL_COMMUNITY): Payer: Self-pay | Admitting: Emergency Medicine

## 2023-12-01 ENCOUNTER — Observation Stay (HOSPITAL_COMMUNITY)
Admission: EM | Admit: 2023-12-01 | Discharge: 2023-12-02 | Disposition: A | Discharge status: Home / Self Care | Payer: Self-pay | Attending: Internal Medicine | Admitting: Internal Medicine

## 2023-12-01 DIAGNOSIS — F411 Generalized anxiety disorder: Secondary | ICD-10-CM | POA: Diagnosis present

## 2023-12-01 DIAGNOSIS — Z79899 Other long term (current) drug therapy: Secondary | ICD-10-CM | POA: Insufficient documentation

## 2023-12-01 DIAGNOSIS — J158 Pneumonia due to other specified bacteria: Secondary | ICD-10-CM | POA: Insufficient documentation

## 2023-12-01 DIAGNOSIS — Z91010 Allergy to peanuts: Secondary | ICD-10-CM | POA: Insufficient documentation

## 2023-12-01 DIAGNOSIS — A419 Sepsis, unspecified organism: Principal | ICD-10-CM

## 2023-12-01 DIAGNOSIS — E876 Hypokalemia: Secondary | ICD-10-CM | POA: Insufficient documentation

## 2023-12-01 DIAGNOSIS — E871 Hypo-osmolality and hyponatremia: Secondary | ICD-10-CM | POA: Insufficient documentation

## 2023-12-01 DIAGNOSIS — N289 Disorder of kidney and ureter, unspecified: Secondary | ICD-10-CM

## 2023-12-01 DIAGNOSIS — J189 Pneumonia, unspecified organism: Secondary | ICD-10-CM

## 2023-12-01 DIAGNOSIS — N2889 Other specified disorders of kidney and ureter: Secondary | ICD-10-CM | POA: Insufficient documentation

## 2023-12-01 DIAGNOSIS — Z1152 Encounter for screening for COVID-19: Secondary | ICD-10-CM | POA: Insufficient documentation

## 2023-12-01 DIAGNOSIS — Z87891 Personal history of nicotine dependence: Secondary | ICD-10-CM | POA: Insufficient documentation

## 2023-12-01 DIAGNOSIS — F419 Anxiety disorder, unspecified: Secondary | ICD-10-CM | POA: Insufficient documentation

## 2023-12-01 LAB — CBC WITH DIFFERENTIAL/PLATELET
Abs Immature Granulocytes: 0.5 10*3/uL — ABNORMAL HIGH (ref 0.00–0.07)
Band Neutrophils: 24 %
Basophils Absolute: 0 10*3/uL (ref 0.0–0.1)
Basophils Relative: 0 %
Eosinophils Absolute: 0 10*3/uL (ref 0.0–0.5)
Eosinophils Relative: 0 %
HCT: 41.7 % (ref 39.0–52.0)
Hemoglobin: 15.1 g/dL (ref 13.0–17.0)
Lymphocytes Relative: 15 %
Lymphs Abs: 0.4 10*3/uL — ABNORMAL LOW (ref 0.7–4.0)
MCH: 31.3 pg (ref 26.0–34.0)
MCHC: 36.2 g/dL — ABNORMAL HIGH (ref 30.0–36.0)
MCV: 86.3 fL (ref 80.0–100.0)
Metamyelocytes Relative: 10 %
Monocytes Absolute: 0.1 10*3/uL (ref 0.1–1.0)
Monocytes Relative: 2 %
Myelocytes: 5 %
Neutro Abs: 1.8 10*3/uL (ref 1.7–7.7)
Neutrophils Relative %: 41 %
Platelets: 188 10*3/uL (ref 150–400)
Promyelocytes Relative: 3 %
RBC: 4.83 MIL/uL (ref 4.22–5.81)
RDW: 12.8 % (ref 11.5–15.5)
WBC: 2.8 10*3/uL — ABNORMAL LOW (ref 4.0–10.5)
nRBC: 0 % (ref 0.0–0.2)

## 2023-12-01 LAB — RESPIRATORY PANEL BY PCR

## 2023-12-01 LAB — BASIC METABOLIC PANEL WITH GFR
Anion gap: 16 — ABNORMAL HIGH (ref 5–15)
BUN: 16 mg/dL (ref 6–20)
CO2: 17 mmol/L — ABNORMAL LOW (ref 22–32)
Calcium: 8.2 mg/dL — ABNORMAL LOW (ref 8.9–10.3)
Chloride: 98 mmol/L (ref 98–111)
Creatinine, Ser: 1.29 mg/dL — ABNORMAL HIGH (ref 0.61–1.24)
GFR, Estimated: >60 mL/min (ref >60)
Glucose, Bld: 113 mg/dL — ABNORMAL HIGH (ref 70–99)
Potassium: 3.1 mmol/L — ABNORMAL LOW (ref 3.5–5.1)
Sodium: 131 mmol/L — ABNORMAL LOW (ref 135–145)

## 2023-12-01 LAB — PROCALCITONIN: Procalcitonin: 35.72 ng/mL

## 2023-12-01 LAB — RAPID HIV SCREEN (HIV 1/2 AB+AG)
HIV 1/2 Antibodies: NONREACTIVE
HIV-1 P24 Antigen - HIV24: NONREACTIVE

## 2023-12-01 LAB — RESP PANEL BY RT-PCR (RSV, FLU A&B, COVID)  RVPGX2
Influenza A by PCR: NEGATIVE
Influenza B by PCR: NEGATIVE
Resp Syncytial Virus by PCR: NEGATIVE
SARS Coronavirus 2 by RT PCR: NEGATIVE

## 2023-12-01 LAB — LACTIC ACID, PLASMA
Lactic Acid, Venous: 2.6 mmol/L (ref 0.5–1.9)
Lactic Acid, Venous: 2.8 mmol/L (ref 0.5–1.9)

## 2023-12-01 MED ORDER — LACTATED RINGERS IV BOLUS
500.0000 mL | Freq: Once | INTRAVENOUS | Status: AC
  Administered 2023-12-01: 500 mL via INTRAVENOUS

## 2023-12-01 MED ORDER — ACETAMINOPHEN 650 MG RE SUPP: 650.0000 mg | Freq: Four times a day (QID) | RECTAL | Status: DC | PRN

## 2023-12-01 MED ORDER — ONDANSETRON HCL 4 MG/2ML IJ SOLN
4.0000 mg | Freq: Four times a day (QID) | INTRAMUSCULAR | Status: DC | PRN
Start: 2023-12-01 — End: 2023-12-02

## 2023-12-01 MED ORDER — IPRATROPIUM-ALBUTEROL 0.5-2.5 (3) MG/3ML IN SOLN: 3.0000 mL | Freq: Four times a day (QID) | RESPIRATORY_TRACT | Status: DC | PRN

## 2023-12-01 MED ORDER — HEPARIN SODIUM (PORCINE) 5000 UNIT/ML IJ SOLN
5000.0000 [IU] | Freq: Three times a day (TID) | INTRAMUSCULAR | Status: DC
  Administered 2023-12-01 – 2023-12-02 (×2): 5000 [IU] via SUBCUTANEOUS
  Filled 2023-12-01 (×2): qty 1

## 2023-12-01 MED ORDER — POTASSIUM CHLORIDE CRYS ER 20 MEQ PO TBCR
40.0000 meq | EXTENDED_RELEASE_TABLET | Freq: Two times a day (BID) | ORAL | Status: AC
  Administered 2023-12-01 (×2): 40 meq via ORAL
  Filled 2023-12-01 (×2): qty 2

## 2023-12-01 MED ORDER — SODIUM CHLORIDE 0.9 % IV SOLN: INTRAVENOUS | Status: DC

## 2023-12-01 MED ORDER — AZITHROMYCIN 250 MG PO TABS
500.0000 mg | ORAL_TABLET | Freq: Once | ORAL | Status: AC
  Administered 2023-12-01: 500 mg via ORAL
  Filled 2023-12-01: qty 2

## 2023-12-01 MED ORDER — DM-GUAIFENESIN ER 30-600 MG PO TB12
1.0000 | ORAL_TABLET | Freq: Two times a day (BID) | ORAL | Status: DC
  Administered 2023-12-01 – 2023-12-02 (×3): 1 via ORAL
  Filled 2023-12-01 (×3): qty 1

## 2023-12-01 MED ORDER — ACETAMINOPHEN 325 MG PO TABS
650.0000 mg | ORAL_TABLET | Freq: Four times a day (QID) | ORAL | Status: DC | PRN
  Administered 2023-12-01 – 2023-12-02 (×3): 650 mg via ORAL
  Filled 2023-12-01 (×3): qty 2

## 2023-12-01 MED ORDER — SODIUM CHLORIDE 0.9 % IV BOLUS
1000.0000 mL | Freq: Once | INTRAVENOUS | Status: AC
  Administered 2023-12-01: 1000 mL via INTRAVENOUS

## 2023-12-01 MED ORDER — ACETAMINOPHEN 500 MG PO TABS
1000.0000 mg | ORAL_TABLET | Freq: Once | ORAL | Status: AC
  Administered 2023-12-01: 1000 mg via ORAL
  Filled 2023-12-01: qty 2

## 2023-12-01 MED ORDER — ONDANSETRON HCL 4 MG PO TABS: 4.0000 mg | ORAL_TABLET | Freq: Four times a day (QID) | ORAL | Status: DC | PRN

## 2023-12-01 MED ORDER — SODIUM CHLORIDE 0.9 % IV SOLN
500.0000 mg | INTRAVENOUS | Status: DC
  Administered 2023-12-02: 500 mg via INTRAVENOUS
  Filled 2023-12-01: qty 5

## 2023-12-01 MED ORDER — SODIUM CHLORIDE 0.9 % IV SOLN
1.0000 g | INTRAVENOUS | Status: DC
  Administered 2023-12-02: 1 g via INTRAVENOUS
  Filled 2023-12-01: qty 10

## 2023-12-01 MED ORDER — SODIUM CHLORIDE 0.9 % IV SOLN
1.0000 g | Freq: Once | INTRAVENOUS | Status: AC
  Administered 2023-12-01: 1 g via INTRAVENOUS
  Filled 2023-12-01: qty 10
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
## 2023-12-01 NOTE — Progress Notes (Signed)
   12/01/23 1451  Assess: MEWS Score  Temp (!) 102.1 F (38.9 C)  BP 115/68  MAP (mmHg) 82  Pulse Rate (!) 110  Resp 19  SpO2 100 %  O2 Device Room Air  Assess: MEWS Score  MEWS Temp 2  MEWS Systolic 0  MEWS Pulse 1  MEWS RR 0  MEWS LOC 0  MEWS Score 3  MEWS Score Color Yellow  Assess: if the MEWS score is Yellow or Red  Were vital signs accurate and taken at a resting state? Yes  Does the patient meet 2 or more of the SIRS criteria? Yes  Does the patient have a confirmed or suspected source of infection? Yes  MEWS guidelines implemented  Yes, yellow  Treat  MEWS Interventions Considered administering scheduled or prn medications/treatments as ordered  Take Vital Signs  Increase Vital Sign Frequency  Yellow: Q2hr x1, continue Q4hrs until patient remains green for 12hrs  Escalate  MEWS: Escalate Yellow: Discuss with charge nurse and consider notifying provider and/or RRT  Notify: Charge Nurse/RN  Name of Charge Nurse/RN Notified Brandi B  Assess: SIRS CRITERIA  SIRS Temperature  1  SIRS Respirations  0  SIRS Pulse 1  SIRS WBC 1  SIRS Score Sum  3   Notified Dr Sherryll Burger

## 2023-12-01 NOTE — ED Notes (Signed)
 Patient ambulated about 50 feet and patient got short of breath and heart rate was about 130. We turned patient around and went back into room.

## 2023-12-01 NOTE — ED Triage Notes (Signed)
 Pt c/o headache and generalized body aches with chills and diarrhea x 5 days

## 2023-12-01 NOTE — ED Provider Notes (Signed)
 [place] [facility] AT [facility] Provider Note   CSN: [id number] Arrival date & time: 12/01/23  7846     History  Chief Complaint  Patient presents with   Generalized Body Aches    Micheal Bryant is a 28 y.o. male.  HPI   This patient is a 28 year old male, he denies chronic medical conditions, states that he has been sick for approximately 5 days with a combination of coughing shortness of breath and now having some discomfort in his abdomen when he coughs hard.  There is some phlegm production, he has had some diarrhea, no vomiting.  He works at [employer] and states that other people at work have been sick with similar type symptoms.  He has not had a medical exam for this illness up to this point.  He is not taking any medications at home  Home Medications Prior to Admission medications   Medication Sig Start Date End Date Taking? Authorizing Provider  amoxicillin-clavulanate (AUGMENTIN) 875-125 MG tablet Take 1 tablet by mouth 2 (two) times daily. 10/26/22   Billie Lade, MD  escitalopram (LEXAPRO) 20 MG tablet Take 1 tablet (20 mg total) by mouth daily. 10/26/22 01/24/23  Billie Lade, MD      Allergies    Fish allergy and Peanut-containing drug products    Review of Systems   Review of Systems  All other systems reviewed and are negative.   Physical Exam Updated Vital Signs BP 106/68   Pulse (!) 102   Temp 99.2 F (37.3 C) (Oral)   Resp 18   Ht 1.956 m (6\' 5" )   Wt 86 kg   SpO2 96%   BMI 22.48 kg/m  Physical Exam Vitals and nursing note reviewed.  Constitutional:      General: He is not in acute distress.    Appearance: He is well-developed.  HENT:     Head: Normocephalic and atraumatic.     Mouth/Throat:     Pharynx: No oropharyngeal exudate.  Eyes:     General: No scleral icterus.       Right eye: No discharge.        Left eye: No discharge.     Conjunctiva/sclera: Conjunctivae normal.     Pupils: Pupils are equal,  round, and reactive to light.  Neck:     Thyroid: No thyromegaly.     Vascular: No JVD.  Cardiovascular:     Rate and Rhythm: Regular rhythm. Tachycardia present.     Heart sounds: Normal heart sounds. No murmur heard.    No friction rub. No gallop.  Pulmonary:     Effort: Pulmonary effort is normal. No respiratory distress.     Breath sounds: Rhonchi present. No wheezing or rales.  Abdominal:     General: Bowel sounds are normal. There is no distension.     Palpations: Abdomen is soft. There is no mass.     Tenderness: There is no abdominal tenderness.  Musculoskeletal:        General: No tenderness. Normal range of motion.     Cervical back: Normal range of motion and neck supple.  Lymphadenopathy:     Cervical: No cervical adenopathy.  Skin:    General: Skin is warm and dry.     Findings: No erythema or rash.  Neurological:     Mental Status: He is alert.     Coordination: Coordination normal.  Psychiatric:        Behavior: Behavior normal.  ED Results / Procedures / Treatments   Labs (all labs ordered are listed, but only abnormal results are displayed) Labs Reviewed  CBC WITH DIFFERENTIAL/PLATELET - Abnormal; Notable for the following components:      Result Value   WBC 2.8 (*)    MCHC 36.2 (*)    Lymphs Abs 0.4 (*)    Abs Immature Granulocytes 0.50 (*)    All other components within normal limits  BASIC METABOLIC PANEL WITH GFR - Abnormal; Notable for the following components:   Sodium 131 (*)    Potassium 3.1 (*)    CO2 17 (*)    Glucose, Bld 113 (*)    Creatinine, Ser 1.29 (*)    Calcium 8.2 (*)    Anion gap 16 (*)    All other components within normal limits  LACTIC ACID, PLASMA - Abnormal; Notable for the following components:   Lactic Acid, Venous 2.6 (*)    All other components within normal limits  RESP PANEL BY RT-PCR (RSV, FLU A&B, COVID)  [id number]  CULTURE, BLOOD (ROUTINE X 2)  CULTURE, BLOOD (ROUTINE X 2)  RAPID HIV SCREEN (HIV 1/2 AB+AG)     EKG None  Radiology DG Chest 2 View Result Date: 12/01/2023 CLINICAL DATA:  28 year old male with headache, body ache, chills and diarrhea. EXAM: CHEST - 2 VIEW COMPARISON:  Chest CT 02/10/2020 and earlier. FINDINGS: PA and lateral views 0726 hours. Bilateral confluent lung base consolidation. And widespread additional patchy and confluent peribronchial opacity in the mid right lung. No significant pleural fluid. Mediastinal contours appear to remain normal. Lower lung volumes compared to previous exams. Visualized tracheal air column is within normal limits. No pneumothorax. No acute osseous abnormality identified. Negative visible bowel gas. IMPRESSION: Severe right > left multilobar and lower lung predominant Pneumonia. No significant pleural effusion. Electronically Signed   By: Odessa Fleming M.D.   On: 12/01/2023 07:48    Procedures .Critical Care  Performed by: Eber Hong, MD Authorized by: Eber Hong, MD   Critical care provider statement:    Critical care time (minutes):  45   Critical care time was exclusive of:  Separately billable procedures and treating other patients   Critical care was necessary to treat or prevent imminent or life-threatening deterioration of the following conditions:  Respiratory failure and sepsis   Critical care was time spent personally by me on the following activities:  Development of treatment plan with patient or surrogate, discussions with consultants, evaluation of patient's response to treatment, examination of patient, obtaining history from patient or surrogate, review of old charts, re-evaluation of patient's condition, pulse oximetry, ordering and review of radiographic studies, ordering and review of laboratory studies and ordering and performing treatments and interventions   I assumed direction of critical care for this patient from another provider in my specialty: no     Care discussed with: admitting provider   Comments:            Medications Ordered in ED Medications  sodium chloride 0.9 % bolus 1,000 mL (has no administration in time range)  acetaminophen (TYLENOL) tablet 1,000 mg (1,000 mg Oral Given 12/01/23 0729)  cefTRIAXone (ROCEPHIN) 1 g in sodium chloride 0.9 % 100 mL IVPB (1 g Intravenous New Bag/Given 12/01/23 0803)  azithromycin (ZITHROMAX) tablet 500 mg (500 mg Oral Given 12/01/23 0801)    ED Course/ Medical Decision Making/ A&P Clinical Course as of 12/01/23 1000  Sun Dec 01, 2023  0928 WBC Morphology: DOHLE BODIES [BM]  Clinical Course User Index [BM] Eber Hong, MD                                 Medical Decision Making Amount and/or Complexity of Data Reviewed Labs: ordered. Decision-making details documented in ED Course. Radiology: ordered.  Risk OTC drugs. Prescription drug management. Decision regarding hospitalization.    This patient presents to the ED for concern of cough and shortness of breath with a fever differential diagnosis includes influenza, COVID, RSV, pneumonia    Additional history obtained:  Additional history obtained from medical record External records from outside source obtained and reviewed including medical records reviewed, no recent admissions to the hospital   Lab Tests:  I Ordered, and personally interpreted labs.  The pertinent results include: CBC shows a leukopenia at 2800 with immature bands present, no anemia, metabolic panel with mild hyponatremia, hypokalemia, decreased CO2 consistent with an acidosis and a renal insufficiency with a creatinine of 1.3.  Anion gap of 16 and a lactate of 2.6.  COVID and flu negative   Imaging Studies ordered:  I ordered imaging studies including chest x-ray, 2 view PA and lateral views I independently visualized and interpreted imaging which showed severe pneumonia, there is right greater than left multilobar pneumonia I agree with the radiologist interpretation   Medicines ordered and prescription  drug management:  I ordered medication including Tylenol for fever Reevaluation of the patient after these medicines showed that the patient improved with regards to temperature, Rocephin and Zithromax given for pneumonia I have reviewed the patients home medicines and have made adjustments as needed   Consultation:   Dr Karenann Cai to admit.   Problem List / ED Course:  The patient does not appear to be in distress, he has a tachycardia commensurate with his fever, will rule out pneumonia with a chest x-ray, viral panel The patient has what appears to be significant findings of sepsis with fever, tachycardia, hypoxia and leukopenia in the presence of pneumonia.  When I ambulate the patient he drops to 90% and becomes very dyspneic and more tachycardic. Supplemental oxygen given, patient will be admitted   Social Determinants of Health:  Ongoing tobacco use.  HIV test pending, the patient states he has never used IV drugs         Final Clinical Impression(s) / ED Diagnoses Final diagnoses:  Sepsis without acute organ dysfunction, due to unspecified organism [facility])  Multifocal pneumonia  Renal insufficiency    Rx / DC Orders ED Discharge Orders     None         Eber Hong, MD 12/01/23 1003

## 2023-12-01 NOTE — Plan of Care (Signed)
   Problem: Education: Goal: Knowledge of General Education information will improve Description: Including pain rating scale, medication(s)/side effects and non-pharmacologic comfort measures Outcome: Progressing   Problem: Activity: Goal: Risk for activity intolerance will decrease Outcome: Progressing   Problem: Nutrition: Goal: Adequate nutrition will be maintained Outcome: Progressing   Problem: Coping: Goal: Level of anxiety will decrease Outcome: Progressing

## 2023-12-02 LAB — BASIC METABOLIC PANEL WITH GFR
Anion gap: 10 (ref 5–15)
BUN: 14 mg/dL (ref 6–20)
CO2: 19 mmol/L — ABNORMAL LOW (ref 22–32)
Calcium: 8.1 mg/dL — ABNORMAL LOW (ref 8.9–10.3)
Chloride: 106 mmol/L (ref 98–111)
Creatinine, Ser: 1.1 mg/dL (ref 0.61–1.24)
GFR, Estimated: >60 mL/min (ref >60)
Glucose, Bld: 90 mg/dL (ref 70–99)
Potassium: 3.6 mmol/L (ref 3.5–5.1)
Sodium: 135 mmol/L (ref 135–145)

## 2023-12-02 LAB — LACTIC ACID, PLASMA: Lactic Acid, Venous: 1.4 mmol/L (ref 0.5–1.9)

## 2023-12-02 LAB — CBC
HCT: 38.6 % — ABNORMAL LOW (ref 39.0–52.0)
Hemoglobin: 13.5 g/dL (ref 13.0–17.0)
MCH: 31 pg (ref 26.0–34.0)
MCHC: 35 g/dL (ref 30.0–36.0)
MCV: 88.5 fL (ref 80.0–100.0)
Platelets: 206 10*3/uL (ref 150–400)
RBC: 4.36 MIL/uL (ref 4.22–5.81)
RDW: 13.1 % (ref 11.5–15.5)
WBC: 4 10*3/uL (ref 4.0–10.5)
nRBC: 0 % (ref 0.0–0.2)

## 2023-12-02 LAB — MAGNESIUM: Magnesium: 2 mg/dL (ref 1.7–2.4)

## 2023-12-02 LAB — PROCALCITONIN: Procalcitonin: 22.75 ng/mL

## 2023-12-02 MED ORDER — DM-GUAIFENESIN ER 30-600 MG PO TB12: 1.0000 | ORAL_TABLET | Freq: Two times a day (BID) | ORAL | 0 refills | Status: AC

## 2023-12-02 MED ORDER — AMOXICILLIN-POT CLAVULANATE 875-125 MG PO TABS: 1.0000 | ORAL_TABLET | Freq: Two times a day (BID) | ORAL | 0 refills | Status: AC

## 2023-12-02 NOTE — Discharge Summary (Signed)
 Physician Discharge Summary  Micheal Bryant ZOX:[id number] DOB: February 12, 1996 DOA: 12/01/2023  PCP: Patient, No Pcp Per  Admit date: 12/01/2023  Discharge date: 12/02/2023  Admitted From: Home  Disposition: Home  Recommendations for Outpatient Follow-up:  Follow up with PCP in 1-2 weeks Continue on Augmentin as prescribed for 5 more days to complete course of treatment for pneumonia Continue home antitussives as needed  Home Health: None  Equipment/Devices: None  Discharge Condition:Stable  CODE STATUS: Full  Diet recommendation: Heart Healthy  Brief/Interim Summary: Micheal Bryant is a 28 y.o. male with medical history significant for anxiety and tobacco abuse who presented to the ED for approximately 5 days worth of symptoms to include coughing, shortness of breath and some generalized bodyaches.  He was admitted with sepsis, POA secondary to community-acquired pneumonia and empirically started on Rocephin and azithromycin.  He was also noted to have AKI which has now resolved.  He also had other electrolyte disturbances which have improved.  He is now in stable condition for discharge with oral antibiotics.  No other acute events or concerns noted.  Discharge Diagnoses:  Principal Problem:   Sepsis (HCC) Active Problems:   GAD (generalized anxiety disorder)  Principal discharge diagnosis: Sepsis, POA secondary to community-acquired pneumonia along with associated AKI.  Discharge Instructions  Discharge Instructions     Diet - low sodium heart healthy   Complete by: As directed    Increase activity slowly   Complete by: As directed       Allergies as of 12/02/2023       Reactions   Fish Allergy Anaphylaxis   Peanut-containing Drug Products Anaphylaxis   Tongue Swelling, Itching    Banana Itching   Itchy tongue        Medication List     STOP taking these medications    guaiFENesin 600 MG 12 hr tablet Commonly known as: MUCINEX       TAKE  these medications    acetaminophen 500 MG tablet Commonly known as: TYLENOL Take 1,000 mg by mouth every 6 (six) hours as needed for mild pain (pain score 1-3).   amoxicillin-clavulanate 875-125 MG tablet Commonly known as: AUGMENTIN Take 1 tablet by mouth 2 (two) times daily for 5 days.   dextromethorphan-guaiFENesin 30-600 MG 12hr tablet Commonly known as: MUCINEX DM Take 1 tablet by mouth 2 (two) times daily.   VICKS DAYQUIL COLD & FLU PO Take 15 mLs by mouth 2 (two) times daily as needed (cough, cold/flu symptoms).        Allergies  Allergen Reactions   Fish Allergy Anaphylaxis   Peanut-Containing Drug Products Anaphylaxis    Tongue Swelling, Itching    Banana Itching    Itchy tongue    Consultations: None   Procedures/Studies: DG Chest 2 View Result Date: 12/01/2023 CLINICAL DATA:  28 year old male with headache, body ache, chills and diarrhea. EXAM: CHEST - 2 VIEW COMPARISON:  Chest CT 02/10/2020 and earlier. FINDINGS: PA and lateral views 0726 hours. Bilateral confluent lung base consolidation. And widespread additional patchy and confluent peribronchial opacity in the mid right lung. No significant pleural fluid. Mediastinal contours appear to remain normal. Lower lung volumes compared to previous exams. Visualized tracheal air column is within normal limits. No pneumothorax. No acute osseous abnormality identified. Negative visible bowel gas. IMPRESSION: Severe right > left multilobar and lower lung predominant Pneumonia. No significant pleural effusion. Electronically Signed   By: Odessa Fleming M.D.   On: 12/01/2023 07:48  Discharge Exam: Vitals:   12/02/23 0202 12/02/23 0548  BP: 111/64 (!) 113/55  Pulse: 96 97  Resp: 16 20  Temp: 98.6 F (37 C) 98.7 F (37.1 C)  SpO2: 95% 96%   Vitals:   12/01/23 2208 12/01/23 2325 12/02/23 0202 12/02/23 0548  BP: 116/62  111/64 (!) 113/55  Pulse: (!) 105  96 97  Resp: 16  16 20   Temp: (!) 101.1 F (38.4 C) 99.3 F  (37.4 C) 98.6 F (37 C) 98.7 F (37.1 C)  TempSrc: Oral Oral Oral Oral  SpO2: 96%  95% 96%  Weight:      Height:        General: Pt is alert, awake, not in acute distress Cardiovascular: RRR, S1/S2 +, no rubs, no gallops Respiratory: CTA bilaterally, no wheezing, no rhonchi Abdominal: Soft, NT, ND, bowel sounds + Extremities: no edema, no cyanosis    The results of significant diagnostics from this hospitalization (including imaging, microbiology, ancillary and laboratory) are listed below for reference.     Microbiology: Recent Results (from the past 240 hours)  Resp panel by RT-PCR (RSV, Flu A&B, Covid) Anterior Nasal Swab     Status: None   Collection Time: 12/01/23  7:20 AM   Specimen: Anterior Nasal Swab  Result Value Ref Range Status   SARS Coronavirus 2 by RT PCR NEGATIVE NEGATIVE Final    Comment: (NOTE) SARS-CoV-2 target nucleic acids are NOT DETECTED.  The SARS-CoV-2 RNA is generally detectable in upper respiratory specimens during the acute phase of infection. The lowest concentration of SARS-CoV-2 viral copies this assay can detect is 138 copies/mL. A negative result does not preclude SARS-Cov-2 infection and should not be used as the sole basis for treatment or other patient management decisions. A negative result may occur with  improper specimen collection/handling, submission of specimen other than nasopharyngeal swab, presence of viral mutation(s) within the areas targeted by this assay, and inadequate number of viral copies(<138 copies/mL). A negative result must be combined with clinical observations, patient history, and epidemiological information. The expected result is Negative.  Fact Sheet for Patients:  BloggerCourse.com  Fact Sheet for Healthcare Providers:  SeriousBroker.it  This test is no t yet approved or cleared by the [place] FDA and  has been authorized for detection and/or  diagnosis of SARS-CoV-2 by FDA under an Emergency Use Authorization (EUA). This EUA will remain  in effect (meaning this test can be used) for the duration of the COVID-19 declaration under Section 564(b)(1) of the Act, 21 U.S.C.section 360bbb-3(b)(1), unless the authorization is terminated  or revoked sooner.       Influenza A by PCR NEGATIVE NEGATIVE Final   Influenza B by PCR NEGATIVE NEGATIVE Final    Comment: (NOTE) The Xpert Xpress SARS-CoV-2/FLU/RSV plus assay is intended as an aid in the diagnosis of influenza from Nasopharyngeal swab specimens and should not be used as a sole basis for treatment. Nasal washings and aspirates are unacceptable for Xpert Xpress SARS-CoV-2/FLU/RSV testing.  Fact Sheet for Patients: BloggerCourse.com  Fact Sheet for Healthcare Providers: SeriousBroker.it  This test is not yet approved or cleared by the [place] FDA and has been authorized for detection and/or diagnosis of SARS-CoV-2 by FDA under an Emergency Use Authorization (EUA). This EUA will remain in effect (meaning this test can be used) for the duration of the COVID-19 declaration under Section 564(b)(1) of the Act, 21 U.S.C. section 360bbb-3(b)(1), unless the authorization is terminated or revoked.     Resp Syncytial  Virus by PCR NEGATIVE NEGATIVE Final    Comment: (NOTE) Fact Sheet for Patients: BloggerCourse.com  Fact Sheet for Healthcare Providers: SeriousBroker.it  This test is not yet approved or cleared by the [place] FDA and has been authorized for detection and/or diagnosis of SARS-CoV-2 by FDA under an Emergency Use Authorization (EUA). This EUA will remain in effect (meaning this test can be used) for the duration of the COVID-19 declaration under Section 564(b)(1) of the Act, 21 U.S.C. section 360bbb-3(b)(1), unless the authorization is terminated  or revoked.  Performed at [facility], [address], [place], [place] [address]   Blood culture (routine x 2)     Status: None (Preliminary result)   Collection Time: 12/01/23  8:49 AM   Specimen: BLOOD  Result Value Ref Range Status   Specimen Description BLOOD RIGHT ANTECUBITAL  Final   Special Requests   Final    BOTTLES DRAWN AEROBIC AND ANAEROBIC Blood Culture adequate volume   Culture   Final    NO GROWTH < 24 HOURS Performed at [facility], [address], [place], [place] [address]    Report Status PENDING  Incomplete  Blood culture (routine x 2)     Status: None (Preliminary result)   Collection Time: 12/01/23  8:49 AM   Specimen: BLOOD  Result Value Ref Range Status   Specimen Description BLOOD BLOOD LEFT HAND  Final   Special Requests   Final    BOTTLES DRAWN AEROBIC AND ANAEROBIC Blood Culture adequate volume   Culture   Final    NO GROWTH < 24 HOURS Performed at [facility], [address] [place], [place] [address]    Report Status PENDING  Incomplete  Respiratory (~20 pathogens) panel by PCR     Status: None   Collection Time: 12/01/23 10:07 AM   Specimen: Nasopharyngeal Swab; Respiratory  Result Value Ref Range Status   Adenovirus NOT DETECTED NOT DETECTED Final   Coronavirus 229E NOT DETECTED NOT DETECTED Final    Comment: (NOTE) The Coronavirus on the Respiratory Panel, DOES NOT test for the novel  Coronavirus (2019 nCoV)    Coronavirus HKU1 NOT DETECTED NOT DETECTED Final   Coronavirus NL63 NOT DETECTED NOT DETECTED Final   Coronavirus OC43 NOT DETECTED NOT DETECTED Final   Metapneumovirus NOT DETECTED NOT DETECTED Final   Rhinovirus / Enterovirus NOT DETECTED NOT DETECTED Final   Influenza A NOT DETECTED NOT DETECTED Final   Influenza B NOT DETECTED NOT DETECTED Final   Parainfluenza Virus 1 NOT DETECTED NOT DETECTED Final   Parainfluenza Virus 2 NOT DETECTED NOT DETECTED Final   Parainfluenza Virus 3 NOT DETECTED NOT DETECTED Final    Parainfluenza Virus 4 NOT DETECTED NOT DETECTED Final   Respiratory Syncytial Virus NOT DETECTED NOT DETECTED Final   Bordetella pertussis NOT DETECTED NOT DETECTED Final   Bordetella Parapertussis NOT DETECTED NOT DETECTED Final   Chlamydophila pneumoniae NOT DETECTED NOT DETECTED Final   Mycoplasma pneumoniae NOT DETECTED NOT DETECTED Final    Comment: Performed at [facility], [address] [place], [place] [address]     Labs: BNP (last 3 results) No results for input(s): "BNP" in the last 8760 hours. Basic Metabolic Panel: Recent Labs  Lab 12/01/23 0802 12/02/23 0427  NA 131* 135  K 3.1* 3.6  CL 98 106  CO2 17* 19*  GLUCOSE 113* 90  BUN 16 14  CREATININE 1.29* 1.10  CALCIUM 8.2* 8.1*  MG  --  2.0  Liver Function Tests: No results for input(s): "AST", "ALT", "ALKPHOS", "BILITOT", "PROT", "ALBUMIN" in the last 168 hours. No results for input(s): "LIPASE", "AMYLASE" in the last 168 hours. No results for input(s): "AMMONIA" in the last 168 hours. CBC: Recent Labs  Lab 12/01/23 0802 12/02/23 0427  WBC 2.8* 4.0  NEUTROABS 1.8  --   HGB 15.1 13.5  HCT 41.7 38.6*  MCV 86.3 88.5  PLT 188 206   Cardiac Enzymes: No results for input(s): "CKTOTAL", "CKMB", "CKMBINDEX", "TROPONINI" in the last 168 hours. BNP: Invalid input(s): "POCBNP" CBG: No results for input(s): "GLUCAP" in the last 168 hours. D-Dimer No results for input(s): "DDIMER" in the last 72 hours. Hgb A1c No results for input(s): "HGBA1C" in the last 72 hours. Lipid Profile No results for input(s): "CHOL", "HDL", "LDLCALC", "TRIG", "CHOLHDL", "LDLDIRECT" in the last 72 hours. Thyroid function studies No results for input(s): "TSH", "T4TOTAL", "T3FREE", "THYROIDAB" in the last 72 hours.  Invalid input(s): "FREET3" Anemia work up No results for input(s): "VITAMINB12", "FOLATE", "FERRITIN", "TIBC", "IRON", "RETICCTPCT" in the last 72 hours. Urinalysis    Component Value Date/Time   COLORURINE  YELLOW 01/04/2011 2230   APPEARANCEUR CLEAR 01/04/2011 2230   LABSPEC >1.030 (H) 01/04/2011 2230   PHURINE 5.5 01/04/2011 2230   GLUCOSEU NEGATIVE 01/04/2011 2230   HGBUR NEGATIVE 01/04/2011 2230   BILIRUBINUR NEGATIVE 01/04/2011 2230   KETONESUR TRACE (A) 01/04/2011 2230   PROTEINUR NEGATIVE 01/04/2011 2230   UROBILINOGEN 0.2 01/04/2011 2230   NITRITE NEGATIVE 01/04/2011 2230   LEUKOCYTESUR  01/04/2011 2230    NEGATIVE MICROSCOPIC NOT DONE ON URINES WITH NEGATIVE PROTEIN, BLOOD, LEUKOCYTES, NITRITE, OR GLUCOSE <1000 mg/dL.   Sepsis Labs Recent Labs  Lab 12/01/23 0802 12/02/23 0427  WBC 2.8* 4.0   Microbiology Recent Results (from the past 240 hours)  Resp panel by RT-PCR (RSV, Flu A&B, Covid) Anterior Nasal Swab     Status: None   Collection Time: 12/01/23  7:20 AM   Specimen: Anterior Nasal Swab  Result Value Ref Range Status   SARS Coronavirus 2 by RT PCR NEGATIVE NEGATIVE Final    Comment: (NOTE) SARS-CoV-2 target nucleic acids are NOT DETECTED.  The SARS-CoV-2 RNA is generally detectable in upper respiratory specimens during the acute phase of infection. The lowest concentration of SARS-CoV-2 viral copies this assay can detect is 138 copies/mL. A negative result does not preclude SARS-Cov-2 infection and should not be used as the sole basis for treatment or other patient management decisions. A negative result may occur with  improper specimen collection/handling, submission of specimen other than nasopharyngeal swab, presence of viral mutation(s) within the areas targeted by this assay, and inadequate number of viral copies(<138 copies/mL). A negative result must be combined with clinical observations, patient history, and epidemiological information. The expected result is Negative.  Fact Sheet for Patients:  BloggerCourse.com  Fact Sheet for Healthcare Providers:  SeriousBroker.it  This test is no t yet  approved or cleared by the [place] FDA and  has been authorized for detection and/or diagnosis of SARS-CoV-2 by FDA under an Emergency Use Authorization (EUA). This EUA will remain  in effect (meaning this test can be used) for the duration of the COVID-19 declaration under Section 564(b)(1) of the Act, 21 U.S.C.section 360bbb-3(b)(1), unless the authorization is terminated  or revoked sooner.       Influenza A by PCR NEGATIVE NEGATIVE Final   Influenza B by PCR NEGATIVE NEGATIVE Final    Comment: (NOTE) The Xpert Xpress SARS-CoV-2/FLU/RSV  plus assay is intended as an aid in the diagnosis of influenza from Nasopharyngeal swab specimens and should not be used as a sole basis for treatment. Nasal washings and aspirates are unacceptable for Xpert Xpress SARS-CoV-2/FLU/RSV testing.  Fact Sheet for Patients: BloggerCourse.com  Fact Sheet for Healthcare Providers: SeriousBroker.it  This test is not yet approved or cleared by the [place] FDA and has been authorized for detection and/or diagnosis of SARS-CoV-2 by FDA under an Emergency Use Authorization (EUA). This EUA will remain in effect (meaning this test can be used) for the duration of the COVID-19 declaration under Section 564(b)(1) of the Act, 21 U.S.C. section 360bbb-3(b)(1), unless the authorization is terminated or revoked.     Resp Syncytial Virus by PCR NEGATIVE NEGATIVE Final    Comment: (NOTE) Fact Sheet for Patients: BloggerCourse.com  Fact Sheet for Healthcare Providers: SeriousBroker.it  This test is not yet approved or cleared by the [place] FDA and has been authorized for detection and/or diagnosis of SARS-CoV-2 by FDA under an Emergency Use Authorization (EUA). This EUA will remain in effect (meaning this test can be used) for the duration of the COVID-19 declaration under Section 564(b)(1) of  the Act, 21 U.S.C. section 360bbb-3(b)(1), unless the authorization is terminated or revoked.  Performed at [facility], [address], [place], [place] [address]   Blood culture (routine x 2)     Status: None (Preliminary result)   Collection Time: 12/01/23  8:49 AM   Specimen: BLOOD  Result Value Ref Range Status   Specimen Description BLOOD RIGHT ANTECUBITAL  Final   Special Requests   Final    BOTTLES DRAWN AEROBIC AND ANAEROBIC Blood Culture adequate volume   Culture   Final    NO GROWTH < 24 HOURS Performed at [facility], [address] [place], [place] [address]    Report Status PENDING  Incomplete  Blood culture (routine x 2)     Status: None (Preliminary result)   Collection Time: 12/01/23  8:49 AM   Specimen: BLOOD  Result Value Ref Range Status   Specimen Description BLOOD BLOOD LEFT HAND  Final   Special Requests   Final    BOTTLES DRAWN AEROBIC AND ANAEROBIC Blood Culture adequate volume   Culture   Final    NO GROWTH < 24 HOURS Performed at [facility] [address] [place], [place] [address]    Report Status PENDING  Incomplete  Respiratory (~20 pathogens) panel by PCR     Status: None   Collection Time: 12/01/23 10:07 AM   Specimen: Nasopharyngeal Swab; Respiratory  Result Value Ref Range Status   Adenovirus NOT DETECTED NOT DETECTED Final   Coronavirus 229E NOT DETECTED NOT DETECTED Final    Comment: (NOTE) The Coronavirus on the Respiratory Panel, DOES NOT test for the novel  Coronavirus (2019 nCoV)    Coronavirus HKU1 NOT DETECTED NOT DETECTED Final   Coronavirus NL63 NOT DETECTED NOT DETECTED Final   Coronavirus OC43 NOT DETECTED NOT DETECTED Final   Metapneumovirus NOT DETECTED NOT DETECTED Final   Rhinovirus / Enterovirus NOT DETECTED NOT DETECTED Final   Influenza A NOT DETECTED NOT DETECTED Final   Influenza B NOT DETECTED NOT DETECTED Final   Parainfluenza Virus 1 NOT DETECTED NOT DETECTED Final   Parainfluenza Virus 2 NOT DETECTED NOT  DETECTED Final   Parainfluenza Virus 3 NOT DETECTED NOT DETECTED Final   Parainfluenza Virus 4 NOT DETECTED NOT DETECTED Final   Respiratory Syncytial Virus NOT DETECTED NOT  DETECTED Final   Bordetella pertussis NOT DETECTED NOT DETECTED Final   Bordetella Parapertussis NOT DETECTED NOT DETECTED Final   Chlamydophila pneumoniae NOT DETECTED NOT DETECTED Final   Mycoplasma pneumoniae NOT DETECTED NOT DETECTED Final    Comment: Performed at [facility], [address], [place], [place] [address]     Time coordinating discharge: 35 minutes  SIGNED:   Erick Blinks, DO [facility] 12/02/2023, 9:48 AM  If 7PM-7AM, please contact night-coverage [email or web address]

## 2023-12-02 NOTE — Progress Notes (Signed)
   12/02/23 1028  TOC Brief Assessment  Insurance and Status Lapsed  Patient has primary care physician No  Home environment has been reviewed from home  Prior level of function: independent  Prior/Current Home Services No current home services  Social Drivers of Health Review SDOH reviewed no interventions necessary  Readmission risk has been reviewed Yes  Transition of care needs no transition of care needs at this time    Pt admitted from home. He does not have insurance or a PCP. Met with pt at bedside to provide verbal and written information on options for care including [facility]. Pt states he will follow up. No other needs identified.

## 2023-12-02 NOTE — Plan of Care (Signed)
  Problem: Education: Goal: Knowledge of General Education information will improve Description: Including pain rating scale, medication(s)/side effects and non-pharmacologic comfort measures Outcome: Progressing   Problem: Health Behavior/Discharge Planning: Goal: Ability to manage health-related needs will improve Outcome: Progressing   Problem: Clinical Measurements: Goal: Ability to maintain clinical measurements within normal limits will improve Outcome: Progressing Goal: Diagnostic test results will improve Outcome: Progressing Goal: Respiratory complications will improve Outcome: Progressing   Problem: Activity: Goal: Risk for activity intolerance will decrease Outcome: Progressing   Problem: Nutrition: Goal: Adequate nutrition will be maintained Outcome: Progressing   Problem: Coping: Goal: Level of anxiety will decrease Outcome: Progressing   Problem: Elimination: Goal: Will not experience complications related to bowel motility Outcome: Progressing   Problem: Pain Managment: Goal: General experience of comfort will improve and/or be controlled Outcome: Progressing   Problem: Safety: Goal: Ability to remain free from injury will improve Outcome: Progressing   Problem: Skin Integrity: Goal: Risk for impaired skin integrity will decrease Outcome: Progressing

## 2023-12-06 LAB — CULTURE, BLOOD (ROUTINE X 2)
Culture: NO GROWTH
Culture: NO GROWTH
Special Requests: ADEQUATE
Special Requests: ADEQUATE
